# Patient Record
Sex: Male | Born: 1955 | Race: Black or African American | Hispanic: No | Marital: Married | Smoking: Former smoker
Health system: Southern US, Community
[De-identification: ages and names within clinical notes are randomized; demographics above are authoritative.]

## PROBLEM LIST (undated history)

## (undated) DIAGNOSIS — G8929 Other chronic pain: Secondary | ICD-10-CM

## (undated) DIAGNOSIS — M549 Dorsalgia, unspecified: Secondary | ICD-10-CM

## (undated) DIAGNOSIS — C3490 Malignant neoplasm of unspecified part of unspecified bronchus or lung: Secondary | ICD-10-CM

## (undated) HISTORY — PX: BACK SURGERY: SHX140

---

## 2005-05-26 ENCOUNTER — Ambulatory Visit (HOSPITAL_COMMUNITY): Admission: RE | Admit: 2005-05-26 | Discharge: 2005-05-27 | Payer: Self-pay | Admitting: Neurosurgery

## 2005-12-22 ENCOUNTER — Encounter: Admission: RE | Admit: 2005-12-22 | Discharge: 2005-12-22 | Payer: Self-pay | Admitting: Neurosurgery

## 2006-01-11 ENCOUNTER — Encounter: Admission: RE | Admit: 2006-01-11 | Discharge: 2006-01-11 | Payer: Self-pay | Admitting: Neurosurgery

## 2006-01-25 ENCOUNTER — Encounter: Admission: RE | Admit: 2006-01-25 | Discharge: 2006-01-25 | Payer: Self-pay | Admitting: Neurosurgery

## 2007-03-08 ENCOUNTER — Inpatient Hospital Stay (HOSPITAL_COMMUNITY): Admission: RE | Admit: 2007-03-08 | Discharge: 2007-03-12 | Payer: Self-pay | Admitting: Neurosurgery

## 2010-08-04 ENCOUNTER — Encounter
Admission: RE | Admit: 2010-08-04 | Discharge: 2010-08-10 | Payer: Self-pay | Source: Home / Self Care | Attending: Physical Medicine & Rehabilitation | Admitting: Physical Medicine & Rehabilitation

## 2010-08-10 ENCOUNTER — Ambulatory Visit: Payer: Self-pay | Admitting: Physical Medicine & Rehabilitation

## 2010-10-13 ENCOUNTER — Encounter
Admission: RE | Admit: 2010-10-13 | Discharge: 2010-12-08 | Payer: Self-pay | Source: Home / Self Care | Attending: Physical Medicine & Rehabilitation | Admitting: Physical Medicine & Rehabilitation

## 2010-10-13 ENCOUNTER — Ambulatory Visit: Payer: Self-pay | Admitting: Physical Medicine & Rehabilitation

## 2011-01-12 ENCOUNTER — Encounter: Payer: Medicare Other | Attending: Physical Medicine & Rehabilitation

## 2011-01-12 ENCOUNTER — Ambulatory Visit: Payer: Medicare Other | Admitting: Physical Medicine & Rehabilitation

## 2011-01-12 DIAGNOSIS — M961 Postlaminectomy syndrome, not elsewhere classified: Secondary | ICD-10-CM

## 2011-01-12 DIAGNOSIS — IMO0002 Reserved for concepts with insufficient information to code with codable children: Secondary | ICD-10-CM | POA: Insufficient documentation

## 2011-01-12 DIAGNOSIS — M545 Low back pain, unspecified: Secondary | ICD-10-CM | POA: Insufficient documentation

## 2011-03-26 NOTE — Op Note (Signed)
NAME:  Logan Briggs, Logan Briggs              ACCOUNT NO.:  [id number]   MEDICAL RECORD NO.:  [id number]          PATIENT TYPE:  INP   LOCATION:  NA                           FACILITY:  [facility]   PHYSICIAN:  Coletta Memos, M.D.     DATE OF BIRTH:  [birth year]   DATE OF PROCEDURE:  03/08/2007  DATE OF DISCHARGE:                               OPERATIVE REPORT   PREOPERATIVE DIAGNOSES:  1. Recurrent disk, left L4-5.  2. Left L4 and left L5 radiculopathies.   POSTOPERATIVE DIAGNOSES:  1. Foraminal narrowing secondary to scar formation at L4-5.  2. Left L4 and left L5 radiculopathies.   PROCEDURES:  1. Left L4-5 transverse lumbar interbody arthrodesis with 11 x 32 mm      Opal cage filled with morcellized autograft, arthrodesis with      morcellized allograft, Vitoss.  2. Posterolateral arthrodesis, L4-5, with morcellized autograft and      allograft graft.  3. Spire plate placement.   COMPLICATIONS:  None.   SURGEON:  Coletta Memos, M.D.   ASSISTANT:  Danae Orleans. Venetia Maxon, M.D.   ANESTHESIA:  General endotracheal.   INDICATIONS:  Logan Briggs is a 55 year old gentleman whom I took to  the operating room in June 2006.  At that time he underwent a left L4-5  laminectomy and diskectomy.  Postoperatively he was slightly better but  always had persistent pain in his left lower extremity and back pain.  After a significant amount of time Logan Briggs has opted for a redo  diskectomy.  It was never clear to me that he had actual recurrence  because on MRI all of the material would essentially enhance with  contrast.  Nevertheless, there was a significant amount of mass effect  on the L5 and L4 nerve roots and be it scar or recurrent disk, he was  still being affected.   OPERATIVE NOTE:  Logan Briggs was taken to the operating room, intubated  and placed under a general anesthetic without difficulty.  A Foley  catheter was placed under sterile conditions.  He was rolled prone onto  a Jackson table  and all pressure points were properly padded.  His back  was prepped and he was draped in a sterile fashion.  I opened the old  incision after infiltrating 10 mL of 0.5% lidocaine with 1:200,000  epinephrine.  I took the incision down and exposed the lamina of that I  believed to be L4 and L5 bilaterally.  a double-ended ganglion knife was  placed inferior to the superior lamina in the exposure at that point,  and that was between the L4 and L5 lamina.  I then just on the left side  used the high-speed drill and Kerrison punch to perform a facetectomy  and laminectomy on the left side.  I did this until I was able to easily  palpate the left L4 pedicle.  There was a great deal of scar tissue.  There was no point that I encountered what I thought was recurrent disk  material.  With that amount of scar tissue, I was able to pass fully  decompress the disk space by using curettes and pituitary rongeurs.  I  also took some down along the midline, where again it was very thickened  material.  When I had done that, I then prepared for the arthrodesis by  scraping the endplates until I had nice, fresh bony beds available.  I  then placed 5 mL of the Vitoss allograft into the disk space.  I packed  the Opal cage with morcellized autograft and then placed that into the  disk space.   I then prepared for the posterolateral arthrodesis.  I decorticated the  lamina of L4 and L5 on the right side.  I then placed another 5 mL of  Vitoss along that area.   We then placed a Spire plate at L4 and L5 with Dr. Fredrich Birks assistance.   There was a great deal of scar tissue surrounding the left side of the  thecal sac at the L4-5 space along L4 and L5 roots, but I was able to  fully decompress the left L4 nerve root and L5.  I took down essentially  the entire facette and that most of the superior facette of L5 overlying  the disk space.   I closed the wound in layered fashion using Vicryl sutures to   reapproximate the thoracolumbar fascia, subcutaneous and subcuticular  layers.  I used 3-0 nylon to reapproximate the skin edges.  A sterile  dressing was applied.  X-ray showed the plate and the interbody in good  position.           ______________________________  Coletta Memos, M.D.     KC/MEDQ  D:  03/08/2007  T:  03/08/2007  Job:  [id number]

## 2011-03-26 NOTE — Discharge Summary (Signed)
NAME:  Logan Briggs, Logan Briggs NO.:  [id number]   MEDICAL RECORD NO.:  [id number]          PATIENT TYPE:  INP   LOCATION:  3004                         FACILITY:  [facility]   PHYSICIAN:  Payton Doughty, M.D.      DATE OF BIRTH:  June 27, 1956   DATE OF ADMISSION:  03/08/2007  DATE OF DISCHARGE:  03/12/2007                               DISCHARGE SUMMARY   ADMISSION DIAGNOSIS:  Lumbar spondylosis L4-5.   DISCHARGE DIAGNOSIS:  Lumbar spondylosis L4-5.   PROCEDURE:  L4-5 laminectomy, diskectomy, interbody fusion and  __________ placement.   COMPLICATIONS:  None.   ATTENDING PHYSICIAN:  Coletta Memos, M.D.   HISTORY:  A 55 year old gentleman whose history and physical and  accounted in the chart.  Had a diskectomy at 4-5 in June 2006 and did  well and has had recurrent pain and recurrent disk and is now admitted  for fusion.  General exam is intact.  He was taken to the operating room  after ascertaining normal laboratory values and underwent fusion as  noted above.  Postoperatively he has done well.  His incision is dry and  well-healing.  His strength is full.  His Foley was removed the first  day, PCA stopped the second day.  He is up, eating, and voiding normally  now.  He is being discharged home in care of his family with Percocet  for pain.  His followup will be in the [place] office in a couple of  weeks.           ______________________________  Payton Doughty, M.D.     MWR/MEDQ  D:  03/12/2007  T:  03/12/2007  Job:  [id number]

## 2011-04-12 ENCOUNTER — Encounter: Payer: Medicare Other | Attending: Neurosurgery | Admitting: Neurosurgery

## 2011-04-12 DIAGNOSIS — M5430 Sciatica, unspecified side: Secondary | ICD-10-CM

## 2011-04-12 DIAGNOSIS — M549 Dorsalgia, unspecified: Secondary | ICD-10-CM | POA: Insufficient documentation

## 2011-04-12 DIAGNOSIS — G8929 Other chronic pain: Secondary | ICD-10-CM | POA: Insufficient documentation

## 2011-04-12 DIAGNOSIS — Z981 Arthrodesis status: Secondary | ICD-10-CM | POA: Insufficient documentation

## 2011-04-12 DIAGNOSIS — M79609 Pain in unspecified limb: Secondary | ICD-10-CM | POA: Insufficient documentation

## 2011-04-12 DIAGNOSIS — M961 Postlaminectomy syndrome, not elsewhere classified: Secondary | ICD-10-CM | POA: Insufficient documentation

## 2011-04-13 NOTE — Assessment & Plan Note (Signed)
HISTORY OF PRESENT ILLNESS:  Logan Briggs is followed here by Dr. Wynn Banker for back pain and left lower extremity pain.  He was involved in the work injury in 2005.  He had neurosurgical of evaluation with [facility] in 2008 and persistent pain.  The patient rates his pain at about 6-7, it is sharp, stabbing, unchanged from previous, some activities increase the pain, medication tends to help.  Mobility, he walks without assistance, does not live, he is on disability.  REVIEW OF SYSTEMS:  Notable for those difficulties, otherwise unremarkable.  PAST MEDICAL HISTORY:  Unchanged.  SOCIAL HISTORY:  Unchanged.  PHYSICAL EXAMINATION:  VITAL SIGNS:  Blood pressure 111/78, pulse 82, respirations 20, O2 sats 93 on room air. MUSCULOSKELETAL:  Motor strength is good in the lower extremities. Sensation is intact.  His affect is bright and alert.  Constitutionally, is within normal limits.  He does walk without a gait again, but he does have some diminished reflexes on the left at the quadriceps and gastrocs.  IMPRESSION: 1. Post laminectomy syndrome. 2. Chronic pain.  PLAN: 1. We are going his hydrocodone 7.5/325 one p.o. t.i.d. just got that     filled, no prescriptions given. 2. Continue his other meds as planned. 3. We will see him back in office in 3 months.  His questions were     encouraged and answered.     Logan Briggs Electronically Signed    RLW/MedQ D:  04/12/2011 13:28:11  T:  04/13/2011 02:11:59  Job #:  [id number]

## 2011-07-13 ENCOUNTER — Encounter: Payer: Medicare Other | Attending: Neurosurgery

## 2011-07-13 ENCOUNTER — Ambulatory Visit: Payer: Medicare Other | Admitting: Physical Medicine & Rehabilitation

## 2011-07-13 DIAGNOSIS — M79609 Pain in unspecified limb: Secondary | ICD-10-CM | POA: Insufficient documentation

## 2011-07-13 DIAGNOSIS — M549 Dorsalgia, unspecified: Secondary | ICD-10-CM | POA: Insufficient documentation

## 2011-07-13 DIAGNOSIS — M961 Postlaminectomy syndrome, not elsewhere classified: Secondary | ICD-10-CM

## 2011-07-13 DIAGNOSIS — Z981 Arthrodesis status: Secondary | ICD-10-CM | POA: Insufficient documentation

## 2011-07-13 DIAGNOSIS — G8929 Other chronic pain: Secondary | ICD-10-CM | POA: Insufficient documentation

## 2011-07-13 NOTE — Assessment & Plan Note (Signed)
REASON FOR VISIT:  Lumbar postlaminectomy syndrome, status post L4-5 fusion.  HISTORY:  Logan Briggs has last been seen by Dr. Kallie Edward on April 12, 2011, last seen by me in March.  Has had no new problems per his report. Pain is 5-6/10 range, sometimes 2/7,  this is similar to prior.  Sleep is good.  He can walk 15 minutes at a time.  He can drive.  He climbs steps.  He is single, lives alone.  Smokes half pack per day.  PHYSICAL EXAMINATION:  VITAL SIGNS: Blood pressure 126/70, pulse 72, respirations 18 and O2 sat 93% on room air. MUSCULOSKELETAL:  Straight leg raising test is negative.  Gait is normal.  Deep tendon reflexes reduced at the left knee compared to right knee, intact bilateral ankles.  Lumbar spine range of motion 25% forward flexion, extension, lateral rotation, and bending.  IMPRESSION:  Lumbar postlaminectomy syndrome, chronic left lumbar radiculopathy.  PLAN: 1. Continue hydrocodone 7.5/325 t.i.d. 2. Continue nortriptyline. 3. I will see him back to 9 months and nurse practitioner visit in 3     months.  We discussed importance of activity, keeping up with     walking.  He does some of his yard work as well.     Erick Colace, M.D. Electronically Signed    AEK/MedQ D:  07/13/2011 15:59:08  T:  07/13/2011 21:51:43  Job #:  [id number]

## 2011-10-05 ENCOUNTER — Encounter: Payer: Medicare Other | Attending: Neurosurgery | Admitting: Neurosurgery

## 2011-10-05 DIAGNOSIS — M5430 Sciatica, unspecified side: Secondary | ICD-10-CM

## 2011-10-05 DIAGNOSIS — M961 Postlaminectomy syndrome, not elsewhere classified: Secondary | ICD-10-CM

## 2011-10-05 DIAGNOSIS — M545 Low back pain, unspecified: Secondary | ICD-10-CM | POA: Insufficient documentation

## 2011-10-05 DIAGNOSIS — IMO0002 Reserved for concepts with insufficient information to code with codable children: Secondary | ICD-10-CM | POA: Insufficient documentation

## 2011-10-05 NOTE — Assessment & Plan Note (Signed)
This is a patient of Dr. Wynn Banker', seen for left-sided low back pain and left radiculopathy.  He reports no change in his pain.  He is doing well with his medicines.  Last UDS, pill count is consistent.  Rates his average pain unchanged at a 7.  It is a dull, aching-type pain.  General activity level is a 5 to a 7.  Pain is worse in the evening and at night.  Pain is worse with walking, standing, and activity.  Rest and medication help.  He walks without assistance.  He does drive. Functionally, he is independent.  REVIEW OF SYSTEMS:  Notable for difficulties described above, otherwise within normal limits.  PAST MEDICAL HISTORY:  Unchanged.  SOCIAL HISTORY:  Unchanged.  FAMILY HISTORY:  Unchanged.  PHYSICAL EXAMINATION:  VITAL SIGNS:  Blood pressure 122/64, pulse 79, O2 sats 96 on room air. NEUROLOGIC:  His motor strength and sensation are intact. CONSTITUTIONAL:  Within normal limits.  He is alert and oriented x3. Has normal gait.  IMPRESSION:  Postlaminectomy syndrome with chronic left lumbar radiculopathy.  PLAN:  We will keep his hydrocodone refilled, he just got that done. Continue nortriptyline.  We will see him back in 3 months.  His questions were encouraged and answered.     Logan Briggs L. Blima Dessert Electronically Signed    RLW/MedQ D:  10/05/2011 13:12:54  T:  10/05/2011 21:24:26  Job #:  [id number]

## 2011-12-28 ENCOUNTER — Encounter: Payer: Medicare Other | Attending: Neurosurgery | Admitting: *Deleted

## 2011-12-28 DIAGNOSIS — M545 Low back pain, unspecified: Secondary | ICD-10-CM | POA: Insufficient documentation

## 2011-12-28 DIAGNOSIS — IMO0002 Reserved for concepts with insufficient information to code with codable children: Secondary | ICD-10-CM | POA: Insufficient documentation

## 2011-12-28 DIAGNOSIS — M961 Postlaminectomy syndrome, not elsewhere classified: Secondary | ICD-10-CM | POA: Insufficient documentation

## 2011-12-28 NOTE — Progress Notes (Signed)
  Subjective:    Patient ID: Logan Briggs, male    DOB: May 09, 1956, 56 y.o.   MRN: [id number]  Back Pain This is a chronic problem. The current episode started more than 1 year ago. The problem occurs constantly. The problem is unchanged. The pain is present in the lumbar spine. The quality of the pain is described as stabbing (sharp). The pain radiates to the left foot. The pain is at a severity of 7/10. The pain is moderate. The pain is the same all the time. The symptoms are aggravated by bending, standing and position. Associated symptoms include leg pain, numbness, tingling and weakness. He has tried home exercises, ice and walking for the symptoms. The treatment provided mild relief.  Leg Pain  Associated symptoms include numbness and tingling.      Review of Systems  Gastrointestinal: Positive for constipation.  Musculoskeletal: Positive for back pain.  Neurological: Positive for tingling, weakness and numbness.       Objective:   Physical Exam        Assessment & Plan:

## 2011-12-29 MED ORDER — HYDROCODONE-ACETAMINOPHEN 7.5-325 MG PO TABS: 1.0000 | ORAL_TABLET | Freq: Four times a day (QID) | ORAL | Status: DC

## 2011-12-29 NOTE — Progress Notes (Signed)
Addended by: Leonie Douglas A on: 12/29/2011 11:24 AM   Modules accepted: Orders

## 2012-02-07 ENCOUNTER — Encounter: Payer: Self-pay | Admitting: Physical Medicine & Rehabilitation

## 2012-02-07 ENCOUNTER — Ambulatory Visit (HOSPITAL_BASED_OUTPATIENT_CLINIC_OR_DEPARTMENT_OTHER): Payer: Medicare Other | Admitting: Physical Medicine & Rehabilitation

## 2012-02-07 ENCOUNTER — Encounter: Payer: Medicare Other | Attending: Neurosurgery

## 2012-02-07 VITALS — BP 125/64 | HR 71 | Resp 16 | Ht 76.0 in | Wt 235.0 lb

## 2012-02-07 DIAGNOSIS — M961 Postlaminectomy syndrome, not elsewhere classified: Secondary | ICD-10-CM | POA: Insufficient documentation

## 2012-02-07 DIAGNOSIS — M5430 Sciatica, unspecified side: Secondary | ICD-10-CM

## 2012-02-07 DIAGNOSIS — IMO0002 Reserved for concepts with insufficient information to code with codable children: Secondary | ICD-10-CM | POA: Insufficient documentation

## 2012-02-07 DIAGNOSIS — M545 Low back pain, unspecified: Secondary | ICD-10-CM | POA: Insufficient documentation

## 2012-02-07 MED ORDER — HYDROCODONE-ACETAMINOPHEN 7.5-325 MG PO TABS: 1.0000 | ORAL_TABLET | Freq: Four times a day (QID) | ORAL | Status: DC

## 2012-02-07 NOTE — Patient Instructions (Signed)
Chronic Back Pain When back pain lasts longer than 3 months, it is called chronic back pain.This pain can be frustrating, but the cause of the pain is rarely dangerous.People with chronic back pain often go through certain periods that are more intense (flare-ups). CAUSES Chronic back pain can be caused by wear and tear (degeneration) on different structures in your back. These structures may include bones, ligaments, or discs. This degeneration may result in more pressure being placed on the nerves that travel to your legs and feet. This can lead to pain traveling from the low back down the back of the legs. When pain lasts longer than 3 months, it is not unusual for people to experience anxiety or depression. Anxiety and depression can also contribute to low back pain. TREATMENT  Establish a regular exercise plan. This is critical to improving your functional level.   Have a self-management plan for when you flare-up. Flare-ups rarely require a medical visit. Regular exercise will help reduce the intensity and frequency of your flare-ups.   Manage how you feel about your back pain and the rest of your life. Anxiety, depression, and feeling that you cannot alter your back pain have been shown to make back pain more intense and debilitating.   Medicines should never be your only treatment. They should be used along with other treatments to help you return to a more active lifestyle.   Procedures such as injections or surgery may be helpful but are rarely necessary. You may be able to get the same results with physical therapy or chiropractic care.  HOME CARE INSTRUCTIONS  Avoid bending, heavy lifting, prolonged sitting, and activities which make the problem worse.   Continue normal activity as much as possible.   Take brief periods of rest throughout the day to reduce your pain during flare-ups.   Follow your back exercise rehabilitation program. This can help reduce symptoms and prevent  more pain.   Only take over-the-counter or prescription medicines as directed by your caregiver. Muscle relaxants are sometimes prescribed. Narcotic pain medicine is discouraged for long-term pain, since addiction is a possible outcome.   If you smoke, quit.   Eat healthy foods and maintain a recommended body weight.  SEEK IMMEDIATE MEDICAL CARE IF:   You have weakness or numbness in one of your legs or feet.   You have trouble controlling your bladder or bowels.   You develop nausea, vomiting, abdominal pain, shortness of breath, or fainting.  Document Released: 12/02/2004 Document Revised: 10/14/2011 Document Reviewed: 10/09/2011 [employer] Patient Information 2012 [employer]. 

## 2012-02-07 NOTE — Progress Notes (Signed)
  Subjective:    Patient ID: Logan Briggs, male    DOB: 31-Jul-1956, 56 y.o.   MRN: [id number]  HPI REASON FOR VISIT: Lumbar postlaminectomy syndrome, status post L4-5  fusion.  Pain Inventory Average Pain 7 Pain Right Now 7 My pain is sharp and stabbing  In the last 24 hours, has pain interfered with the following? General activity 5 Relation with others 5 Enjoyment of life 6 What TIME of day is your pain at its worst? Evening and Night Sleep (in general) Good  Pain is worse with: some activites Pain improves with: heat/ice and medication Relief from Meds: 7  Mobility how many minutes can you walk? 1 hour ability to climb steps?  yes do you drive?  yes Do you have any goals in this area?  no  Function I need assistance with the following:  household duties  Neuro/Psych No problems in this area  Prior Studies Any changes since last visit?  no  Physicians involved in your care Any changes since last visit?  no  Review of Systems  Constitutional: Negative.   HENT: Negative.   Eyes: Negative.   Respiratory: Negative.   Cardiovascular: Negative.   Gastrointestinal: Positive for constipation.  Genitourinary: Negative.   Musculoskeletal: Negative.   Skin: Negative.   Neurological: Negative.   Hematological: Negative.   Psychiatric/Behavioral: Negative.        Objective:   Physical Exam  Constitutional: He is oriented to person, place, and time. He appears well-developed and well-nourished.  HENT:  Head: Normocephalic and atraumatic.  Musculoskeletal:       Lumbar back: He exhibits decreased range of motion.       Extension at the lumbar spine is more painful than flexion.  Neurological: He is alert and oriented to person, place, and time.  Reflex Scores:      Patellar reflexes are 1+ on the right side and 1+ on the left side.      Achilles reflexes are 1+ on the right side and 1+ on the left side. Psychiatric: He has a normal mood and affect.   straight leg raising test is negative.        Assessment & Plan:  1. Lumbar postlaminectomy syndrome with chronic postoperative pain. He does have some radicular symptoms but does not wish to try another neuropathic pain medication such as gabapentin. He has been on nortriptyline in the past.

## 2012-02-08 ENCOUNTER — Encounter: Payer: Self-pay | Admitting: Physical Medicine & Rehabilitation

## 2012-03-14 ENCOUNTER — Encounter: Payer: Medicare Other | Attending: Neurosurgery

## 2012-03-14 ENCOUNTER — Encounter: Payer: Self-pay | Admitting: Physical Medicine & Rehabilitation

## 2012-03-14 ENCOUNTER — Ambulatory Visit (HOSPITAL_BASED_OUTPATIENT_CLINIC_OR_DEPARTMENT_OTHER): Payer: Medicare Other | Admitting: Physical Medicine & Rehabilitation

## 2012-03-14 ENCOUNTER — Telehealth: Payer: Self-pay | Admitting: Physical Medicine & Rehabilitation

## 2012-03-14 ENCOUNTER — Other Ambulatory Visit: Payer: Self-pay

## 2012-03-14 VITALS — BP 139/65 | HR 89 | Resp 14 | Ht 75.0 in | Wt 242.0 lb

## 2012-03-14 DIAGNOSIS — IMO0002 Reserved for concepts with insufficient information to code with codable children: Secondary | ICD-10-CM | POA: Insufficient documentation

## 2012-03-14 DIAGNOSIS — M545 Low back pain, unspecified: Secondary | ICD-10-CM | POA: Insufficient documentation

## 2012-03-14 DIAGNOSIS — M961 Postlaminectomy syndrome, not elsewhere classified: Secondary | ICD-10-CM | POA: Insufficient documentation

## 2012-03-14 MED ORDER — HYDROCODONE-ACETAMINOPHEN 7.5-325 MG PO TABS: 1.0000 | ORAL_TABLET | Freq: Four times a day (QID) | ORAL | Status: DC

## 2012-03-14 NOTE — Telephone Encounter (Signed)
What about leg pain medicine that was to be faxed to [facility]?  He was here this morning.

## 2012-03-14 NOTE — Patient Instructions (Signed)
May alternate heat and ice to L buttocks for the next 1-2 weeks.

## 2012-03-14 NOTE — Telephone Encounter (Signed)
Hydrocodone was to help with leg pain. Pt aware.

## 2012-03-14 NOTE — Progress Notes (Signed)
  Subjective:    Patient ID: Logan Briggs, male    DOB: 04/16/1956, 56 y.o.   MRN: [id number]  HPI Increase L buttock and leg pain after sitting on wallet.  No trauma.  No other issues.  States friend smoke THC "would that show up"? Pain Inventory Average Pain 7 Pain Right Now 7 My pain is sharp and stabbing  In the last 24 hours, has pain interfered with the following? General activity 5 Relation with others 5 Enjoyment of life 6 What TIME of day is your pain at its worst? evening and night Sleep (in general) Good  Pain is worse with: some activites Pain improves with: heat/ice Relief from Meds: 7  Mobility walk without assistance how many minutes can you walk? 1 hr ability to climb steps?  yes do you drive?  yes Do you have any goals in this area?  no  Function not employed: date last employed   Neuro/Psych No problems in this area  Prior Studies Any changes since last visit?  no  Physicians involved in your care Any changes since last visit?  no        Review of Systems  Constitutional: Negative.   HENT: Negative.   Eyes: Negative.   Respiratory: Negative.   Cardiovascular: Negative.   Gastrointestinal: Negative.   Genitourinary: Negative.   Musculoskeletal: Negative.   Skin: Negative.   Neurological: Negative.   Hematological: Negative.   Psychiatric/Behavioral: Negative.        Objective:   Physical Exam  Constitutional: He is oriented to person, place, and time. He appears well-developed and well-nourished.  Musculoskeletal:       Lumbar back: Normal.  Neurological: He is alert and oriented to person, place, and time. He has normal strength and normal reflexes. No sensory deficit. Gait normal.          Assessment & Plan:  1.Lumbar post lami syndrome s/p L4-5 lami.  Functioning Indep, no need forhandicap tag.  ? Marijuana use , check UDS.  RTC 3 mo PA clinic 2.  L leg buttocks pain after sitting on wallet, likely myofascial, no sign  of sciatic neuropathy, should improve over the next couple weeks

## 2012-03-15 ENCOUNTER — Telehealth: Payer: Self-pay | Admitting: *Deleted

## 2012-03-15 ENCOUNTER — Other Ambulatory Visit: Payer: Self-pay | Admitting: Physical Medicine & Rehabilitation

## 2012-03-15 NOTE — Telephone Encounter (Signed)
Received a fax from pt pharmacy asking for a refill on Pamelor 10mg  1 po QHS #30. Is it okay to fill this?

## 2012-03-16 MED ORDER — NORTRIPTYLINE HCL 10 MG PO CAPS: 10.0000 mg | ORAL_CAPSULE | Freq: Every day | ORAL | Status: DC

## 2012-03-16 NOTE — Telephone Encounter (Signed)
OK to RF

## 2012-03-16 NOTE — Telephone Encounter (Signed)
Rx refilled.

## 2012-04-20 ENCOUNTER — Other Ambulatory Visit: Payer: Self-pay | Admitting: *Deleted

## 2012-04-20 MED ORDER — HYDROCODONE-ACETAMINOPHEN 7.5-325 MG PO TABS: 1.0000 | ORAL_TABLET | Freq: Four times a day (QID) | ORAL | Status: DC

## 2012-05-22 ENCOUNTER — Other Ambulatory Visit: Payer: Self-pay | Admitting: *Deleted

## 2012-05-22 MED ORDER — HYDROCODONE-ACETAMINOPHEN 7.5-325 MG PO TABS: 1.0000 | ORAL_TABLET | Freq: Four times a day (QID) | ORAL | Status: DC

## 2012-06-14 ENCOUNTER — Encounter: Payer: Self-pay | Admitting: Physical Medicine and Rehabilitation

## 2012-06-14 ENCOUNTER — Encounter
Payer: Medicare Other | Attending: Physical Medicine and Rehabilitation | Admitting: Physical Medicine and Rehabilitation

## 2012-06-14 VITALS — BP 145/64 | HR 69 | Resp 14 | Ht 75.0 in | Wt 240.6 lb

## 2012-06-14 DIAGNOSIS — Z79899 Other long term (current) drug therapy: Secondary | ICD-10-CM | POA: Insufficient documentation

## 2012-06-14 DIAGNOSIS — M961 Postlaminectomy syndrome, not elsewhere classified: Secondary | ICD-10-CM | POA: Insufficient documentation

## 2012-06-14 DIAGNOSIS — R209 Unspecified disturbances of skin sensation: Secondary | ICD-10-CM | POA: Insufficient documentation

## 2012-06-14 DIAGNOSIS — M79609 Pain in unspecified limb: Secondary | ICD-10-CM | POA: Insufficient documentation

## 2012-06-14 DIAGNOSIS — M545 Low back pain, unspecified: Secondary | ICD-10-CM | POA: Insufficient documentation

## 2012-06-14 DIAGNOSIS — G8929 Other chronic pain: Secondary | ICD-10-CM | POA: Insufficient documentation

## 2012-06-14 DIAGNOSIS — Q7649 Other congenital malformations of spine, not associated with scoliosis: Secondary | ICD-10-CM

## 2012-06-14 DIAGNOSIS — M7989 Other specified soft tissue disorders: Secondary | ICD-10-CM | POA: Insufficient documentation

## 2012-06-14 DIAGNOSIS — M4326 Fusion of spine, lumbar region: Secondary | ICD-10-CM

## 2012-06-14 DIAGNOSIS — R252 Cramp and spasm: Secondary | ICD-10-CM | POA: Insufficient documentation

## 2012-06-14 MED ORDER — METHOCARBAMOL 500 MG PO TABS: 500.0000 mg | ORAL_TABLET | Freq: Three times a day (TID) | ORAL | Status: AC

## 2012-06-14 MED ORDER — HYDROCODONE-ACETAMINOPHEN 7.5-325 MG PO TABS: 1.0000 | ORAL_TABLET | Freq: Four times a day (QID) | ORAL | Status: DC

## 2012-06-14 NOTE — Patient Instructions (Signed)
Continue with exercising and stretching, continue with walking program.

## 2012-06-14 NOTE — Progress Notes (Signed)
Subjective:    Patient ID: Logan Briggs, male    DOB: 09-14-1956, 56 y.o.   MRN: [id number]  HPI The patient complains about chronic low back pain which radiates into left LE, down to the lateral lower leg. The patient also complains about numbness and tingling in the same distribution. The problem has been stable . Hx of L4-5 fusion,2010. Patient states, that his left lower leg is a little swollen. Pain Inventory Average Pain 7 Pain Right Now 7 My pain is sharp and stabbing  In the last 24 hours, has pain interfered with the following? General activity 5 Relation with others 5 Enjoyment of life 6 What TIME of day is your pain at its worst? evening and night Sleep (in general) Good  Pain is worse with: some activites Pain improves with: heat/ice and medication Relief from Meds: 7  Mobility walk without assistance how many minutes can you walk? 60 ability to climb steps?  yes do you drive?  yes  Function disabled: date disabled  I need assistance with the following:  household duties  Neuro/Psych weakness numbness tingling  Prior Studies Any changes since last visit?  no  Physicians involved in your care Any changes since last visit?  no   History reviewed. No pertinent family history. History   Social History  . Marital Status: Married    Spouse Name: N/A    Number of Children: N/A  . Years of Education: N/A   Social History Main Topics  . Smoking status: Current Everyday Smoker -- 1.5 packs/day for 20 years    Types: Cigarettes  . Smokeless tobacco: Never Used   Comment: desires to quit but starts again when upset or stressed  . Alcohol Use: Yes     only on holidays, beer  . Drug Use: No  . Sexually Active: None   Other Topics Concern  . None   Social History Narrative  . None   History reviewed. No pertinent past surgical history. Past Medical History  Diagnosis Date  . Glaucoma    BP 145/64  Pulse 69  Resp 14  Ht 6\' 3"  (1.905 m)   Wt 240 lb 9.6 oz (109.135 kg)  BMI 30.07 kg/m2  SpO2 95%    Review of Systems  Musculoskeletal: Positive for back pain.  Neurological: Positive for weakness and numbness.       Tingling  All other systems reviewed and are negative.       Objective:   Physical Exam  Constitutional: He is oriented to person, place, and time. He appears well-developed and well-nourished.  HENT:  Head: Normocephalic.  Neck: Neck supple.  Musculoskeletal: He exhibits tenderness.  Neurological: He is alert and oriented to person, place, and time.  Skin: Skin is warm and dry.  Psychiatric: He has a normal mood and affect.    Symmetric normal motor tone is noted throughout. Normal muscle bulk. Muscle testing reveals 5/5 muscle strength of the upper extremity, and 5/5 of the lower extremity. Full range of motion in upper and lower extremities. ROM of spine is restricted. Fine motor movements are normal in both hands.  DTR in the upper and lower extremity are present and symmetric 1+. No clonus is noted.  Patient arises from chair without difficulty. Narrow based gait with normal arm swing bilateral , able to walk on heels and toes . Tandem walk is stable. No pronator drift. Rhomberg negative. Not really noticing swelling of left lower leg, which patient is complaining about, no  erythema, no increase in temp.       Assessment & Plan:  1. Lumbar postlaminectomy syndrome with chronic postoperative pain. He does have some radicular symptoms but does not wish to try another neuropathic pain medication such as gabapentin. He has been on nortriptyline in the past. 2. Prescribed Robaxin for muscle cramps prn Continue with exercising and walking program. Follow up with PCP for very mild subjective swollen left lower leg. Follow up in 3 month

## 2012-07-25 ENCOUNTER — Other Ambulatory Visit: Payer: Self-pay | Admitting: *Deleted

## 2012-07-25 MED ORDER — HYDROCODONE-ACETAMINOPHEN 7.5-325 MG PO TABS: 1.0000 | ORAL_TABLET | Freq: Four times a day (QID) | ORAL | Status: DC

## 2012-08-24 ENCOUNTER — Other Ambulatory Visit: Payer: Self-pay | Admitting: *Deleted

## 2012-08-24 MED ORDER — HYDROCODONE-ACETAMINOPHEN 7.5-325 MG PO TABS: 1.0000 | ORAL_TABLET | Freq: Four times a day (QID) | ORAL | Status: DC

## 2012-09-13 ENCOUNTER — Encounter
Payer: Medicare Other | Attending: Physical Medicine and Rehabilitation | Admitting: Physical Medicine and Rehabilitation

## 2012-09-18 ENCOUNTER — Encounter: Payer: Self-pay | Admitting: Physical Medicine and Rehabilitation

## 2012-09-18 ENCOUNTER — Encounter
Payer: Medicare Other | Attending: Physical Medicine and Rehabilitation | Admitting: Physical Medicine and Rehabilitation

## 2012-09-18 VITALS — BP 133/68 | HR 82 | Resp 14 | Ht 74.0 in | Wt 246.0 lb

## 2012-09-18 DIAGNOSIS — F17200 Nicotine dependence, unspecified, uncomplicated: Secondary | ICD-10-CM | POA: Insufficient documentation

## 2012-09-18 DIAGNOSIS — Z981 Arthrodesis status: Secondary | ICD-10-CM | POA: Insufficient documentation

## 2012-09-18 DIAGNOSIS — G8928 Other chronic postprocedural pain: Secondary | ICD-10-CM | POA: Insufficient documentation

## 2012-09-18 DIAGNOSIS — M549 Dorsalgia, unspecified: Secondary | ICD-10-CM

## 2012-09-18 DIAGNOSIS — Z5181 Encounter for therapeutic drug level monitoring: Secondary | ICD-10-CM

## 2012-09-18 DIAGNOSIS — M545 Low back pain, unspecified: Secondary | ICD-10-CM | POA: Insufficient documentation

## 2012-09-18 DIAGNOSIS — R209 Unspecified disturbances of skin sensation: Secondary | ICD-10-CM | POA: Insufficient documentation

## 2012-09-18 DIAGNOSIS — M961 Postlaminectomy syndrome, not elsewhere classified: Secondary | ICD-10-CM

## 2012-09-18 MED ORDER — HYDROCODONE-ACETAMINOPHEN 7.5-325 MG PO TABS: 1.0000 | ORAL_TABLET | Freq: Four times a day (QID) | ORAL | Status: DC

## 2012-09-18 NOTE — Progress Notes (Signed)
Subjective:    Patient ID: Logan Briggs, male    DOB: 25-Sep-1956, 56 y.o.   MRN: [id number]  HPI The patient complains about chronic low back pain which radiates into left LE, down to the lateral lower leg. The patient also complains about numbness and tingling in the same distribution.  The problem has been stable . Hx of L4-5 fusion,2010.  Pain Inventory Average Pain 6 Pain Right Now 6 My pain is constant, dull and tingling  In the last 24 hours, has pain interfered with the following? General activity 8 Relation with others 7 Enjoyment of life 7 What TIME of day is your pain at its worst? all the time Sleep (in general) Fair  Pain is worse with: walking, bending, sitting and standing Pain improves with: rest and medication Relief from Meds: 8  Mobility walk without assistance how many minutes can you walk? 20-30 ability to climb steps?  yes do you drive?  yes  Function disabled: date disabled   Neuro/Psych weakness tremor  Prior Studies Any changes since last visit?  no  Physicians involved in your care Any changes since last visit?  no   History reviewed. No pertinent family history. History   Social History  . Marital Status: Married    Spouse Name: N/A    Number of Children: N/A  . Years of Education: N/A   Social History Main Topics  . Smoking status: Current Every Day Smoker -- 1.5 packs/day for 20 years    Types: Cigarettes  . Smokeless tobacco: Never Used     Comment: desires to quit but starts again when upset or stressed  . Alcohol Use: Yes     Comment: only on holidays, beer  . Drug Use: No  . Sexually Active: None   Other Topics Concern  . None   Social History Narrative  . None   History reviewed. No pertinent past surgical history. Past Medical History  Diagnosis Date  . Glaucoma    BP 133/68  Pulse 82  Resp 14  Ht 6\' 2"  (1.88 m)  Wt 246 lb (111.585 kg)  BMI 31.58 kg/m2  SpO2 97%    Review of Systems    Musculoskeletal: Positive for back pain.  Neurological: Positive for tremors and weakness.  All other systems reviewed and are negative.       Objective:   Physical Exam Constitutional: He is oriented to person, place, and time. He appears well-developed and well-nourished.  HENT:  Head: Normocephalic.  Neck: Neck supple.  Musculoskeletal: He exhibits tenderness.  Neurological: He is alert and oriented to person, place, and time.  Skin: Skin is warm and dry.  Psychiatric: He has a normal mood and affect.   Symmetric normal motor tone is noted throughout. Normal muscle bulk. Muscle testing reveals 5/5 muscle strength of the upper extremity, and 5/5 of the lower extremity. Full range of motion in upper and lower extremities. ROM of spine is restricted. Fine motor movements are normal in both hands.  DTR in the upper and lower extremity are present and symmetric 1+. No clonus is noted.  Patient arises from chair without difficulty. Narrow based gait with normal arm swing bilateral , able to walk on heels and toes . Tandem walk is stable. No pronator drift. Rhomberg negative.        Assessment & Plan:  1. Lumbar postlaminectomy syndrome with chronic postoperative pain. He does have some radicular symptoms but does not wish to try another neuropathic pain medication  such as gabapentin. He has been on nortriptyline in the past.  2. Prescribed Robaxin for muscle cramps prn , patient could not tolerate the medication, he could not swallow it, and did not make a big difference anyhow. Advised patient to massage his left hip with a tennis ball, when he has cramps in this area. Continue with exercising and walking program.   Follow up in 3 month

## 2012-09-18 NOTE — Patient Instructions (Signed)
Continue with your exercise and walking program 

## 2012-10-24 ENCOUNTER — Telehealth: Payer: Self-pay | Admitting: *Deleted

## 2012-10-24 MED ORDER — HYDROCODONE-ACETAMINOPHEN 7.5-325 MG PO TABS: 1.0000 | ORAL_TABLET | Freq: Four times a day (QID) | ORAL | Status: DC

## 2012-10-24 NOTE — Telephone Encounter (Signed)
Patient needs refill on Hydrocodone °

## 2012-11-28 ENCOUNTER — Telehealth: Payer: Self-pay

## 2012-11-28 MED ORDER — HYDROCODONE-ACETAMINOPHEN 7.5-325 MG PO TABS: 1.0000 | ORAL_TABLET | Freq: Four times a day (QID) | ORAL | Status: DC

## 2012-11-28 NOTE — Telephone Encounter (Signed)
Patient called for refill on hydrocodone.  [facility] on [address].

## 2012-11-28 NOTE — Telephone Encounter (Signed)
Left message letting patient know that his prescription was refilled.

## 2012-12-18 ENCOUNTER — Encounter
Payer: Medicare Other | Attending: Physical Medicine and Rehabilitation | Admitting: Physical Medicine and Rehabilitation

## 2012-12-18 ENCOUNTER — Encounter: Payer: Self-pay | Admitting: Physical Medicine and Rehabilitation

## 2012-12-18 VITALS — BP 132/74 | HR 72 | Resp 14 | Ht 76.0 in | Wt 248.0 lb

## 2012-12-18 DIAGNOSIS — G8928 Other chronic postprocedural pain: Secondary | ICD-10-CM | POA: Insufficient documentation

## 2012-12-18 DIAGNOSIS — M961 Postlaminectomy syndrome, not elsewhere classified: Secondary | ICD-10-CM | POA: Insufficient documentation

## 2012-12-18 DIAGNOSIS — M79609 Pain in unspecified limb: Secondary | ICD-10-CM | POA: Insufficient documentation

## 2012-12-18 DIAGNOSIS — R209 Unspecified disturbances of skin sensation: Secondary | ICD-10-CM | POA: Insufficient documentation

## 2012-12-18 DIAGNOSIS — R252 Cramp and spasm: Secondary | ICD-10-CM | POA: Insufficient documentation

## 2012-12-18 MED ORDER — HYDROCODONE-ACETAMINOPHEN 7.5-325 MG PO TABS: 1.0000 | ORAL_TABLET | Freq: Four times a day (QID) | ORAL | Status: DC

## 2012-12-18 NOTE — Progress Notes (Signed)
Subjective:    Patient ID: Logan Briggs, male    DOB: November 25, 1955, 57 y.o.   MRN: [id number]  HPI The patient complains about chronic low back pain which radiates into left LE, down to the lateral lower leg. The patient also complains about numbness and tingling in the same distribution.  The problem has been stable . Hx of L4-5 fusion,2010.  Pain Inventory Average Pain 7 Pain Right Now 7 My pain is sharp and stabbing  In the last 24 hours, has pain interfered with the following? General activity 5 Relation with others 5 Enjoyment of life 6 What TIME of day is your pain at its worst? evening Sleep (in general) Good  Pain is worse with: some activites Pain improves with: heat/ice and medication Relief from Meds: 7  Mobility how many minutes can you walk? 60 ability to climb steps?  yes do you drive?  yes Do you have any goals in this area?  no  Function I need assistance with the following:  household duties  Neuro/Psych No problems in this area  Prior Studies Any changes since last visit?  no  Physicians involved in your care Any changes since last visit?  no   History reviewed. No pertinent family history. History   Social History  . Marital Status: Married    Spouse Name: N/A    Number of Children: N/A  . Years of Education: N/A   Social History Main Topics  . Smoking status: Current Every Day Smoker -- 1.50 packs/day for 20 years    Types: Cigarettes  . Smokeless tobacco: Never Used     Comment: desires to quit but starts again when upset or stressed  . Alcohol Use: Yes     Comment: only on holidays, beer  . Drug Use: No  . Sexually Active: None   Other Topics Concern  . None   Social History Narrative  . None   History reviewed. No pertinent past surgical history. Past Medical History  Diagnosis Date  . Glaucoma    BP 132/74  Pulse 72  Resp 14  Ht 6\' 4"  (1.93 m)  Wt 248 lb (112.492 kg)  BMI 30.2 kg/m2  SpO2 97%     Review of  Systems  Musculoskeletal: Positive for back pain.  All other systems reviewed and are negative.       Objective:   Physical Exam Constitutional: He is oriented to person, place, and time. He appears well-developed and well-nourished.  HENT:  Head: Normocephalic.  Neck: Neck supple.  Musculoskeletal: He exhibits tenderness.  Neurological: He is alert and oriented to person, place, and time.  Skin: Skin is warm and dry.  Psychiatric: He has a normal mood and affect.  Symmetric normal motor tone is noted throughout. Normal muscle bulk. Muscle testing reveals 5/5 muscle strength of the upper extremity, and 5/5 of the lower extremity. Full range of motion in upper and lower extremities. ROM of spine is restricted. Fine motor movements are normal in both hands.  DTR in the upper and lower extremity are present and symmetric 1+. No clonus is noted.  Patient arises from chair without difficulty. Narrow based gait with normal arm swing bilateral , able to walk on heels and toes . Tandem walk is stable. No pronator drift. Rhomberg negative.        Assessment & Plan:  1. Lumbar postlaminectomy syndrome with chronic postoperative pain. He does have some radicular symptoms but does not wish to try another neuropathic pain  medication such as gabapentin. He has been on nortriptyline in the past.  2. Prescribed Robaxin for muscle cramps prn , patient could not tolerate the medication, he could not swallow it, and did not make a big difference anyhow. Advised patient to massage his left hip with a tennis ball, when he has cramps in this area.  Continue with exercising and walking program.  Follow up in 3 month

## 2012-12-18 NOTE — Patient Instructions (Signed)
Continue with your exercise program 

## 2013-01-23 ENCOUNTER — Telehealth: Payer: Self-pay | Admitting: *Deleted

## 2013-01-23 MED ORDER — HYDROCODONE-ACETAMINOPHEN 7.5-325 MG PO TABS: 1.0000 | ORAL_TABLET | Freq: Four times a day (QID) | ORAL | Status: DC

## 2013-01-23 NOTE — Telephone Encounter (Signed)
Refill medication. (last 12/18/12)hydrocodone.  Refilled.

## 2013-02-23 ENCOUNTER — Other Ambulatory Visit: Payer: Self-pay | Admitting: Physical Medicine and Rehabilitation

## 2013-02-26 ENCOUNTER — Encounter: Payer: Self-pay | Admitting: *Deleted

## 2013-02-26 ENCOUNTER — Telehealth: Payer: Self-pay | Admitting: *Deleted

## 2013-02-26 MED ORDER — HYDROCODONE-ACETAMINOPHEN 7.5-325 MG PO TABS: 1.0000 | ORAL_TABLET | Freq: Four times a day (QID) | ORAL | Status: DC

## 2013-02-26 NOTE — Telephone Encounter (Signed)
Refill hydrocodone. Refilled and pt notified.

## 2013-03-14 ENCOUNTER — Encounter
Payer: Medicare Other | Attending: Physical Medicine and Rehabilitation | Admitting: Physical Medicine and Rehabilitation

## 2013-03-14 ENCOUNTER — Encounter: Payer: Self-pay | Admitting: Physical Medicine and Rehabilitation

## 2013-03-14 VITALS — BP 143/61 | HR 69 | Resp 14 | Ht 74.0 in | Wt 248.0 lb

## 2013-03-14 DIAGNOSIS — G8929 Other chronic pain: Secondary | ICD-10-CM | POA: Insufficient documentation

## 2013-03-14 DIAGNOSIS — Z5181 Encounter for therapeutic drug level monitoring: Secondary | ICD-10-CM

## 2013-03-14 DIAGNOSIS — G8928 Other chronic postprocedural pain: Secondary | ICD-10-CM | POA: Insufficient documentation

## 2013-03-14 DIAGNOSIS — Z79899 Other long term (current) drug therapy: Secondary | ICD-10-CM

## 2013-03-14 DIAGNOSIS — R252 Cramp and spasm: Secondary | ICD-10-CM | POA: Insufficient documentation

## 2013-03-14 DIAGNOSIS — M961 Postlaminectomy syndrome, not elsewhere classified: Secondary | ICD-10-CM | POA: Insufficient documentation

## 2013-03-14 NOTE — Progress Notes (Signed)
Subjective:    Patient ID: Logan Briggs, male    DOB: [birth year], 57 y.o.   MRN: [id number]  HPI The patient complains about chronic low back pain which radiates into left LE, down to the lateral lower leg. The patient also complains about numbness and tingling in the same distribution.  The problem has been stable . He still does some exercises at home and walks regularly.  Hx of L4-5 fusion,2010, by Dr. Mikal Plane.  Pain Inventory Average Pain 7 Pain Right Now 7 My pain is sharp and stabbing  In the last 24 hours, has pain interfered with the following? General activity 5 Relation with others 5 Enjoyment of life 6 What TIME of day is your pain at its worst? evening and night Sleep (in general) Good  Pain is worse with: some activites Pain improves with: heat/ice and medication Relief from Meds: 7  Mobility how many minutes can you walk? 60 ability to climb steps?  yes do you drive?  yes Do you have any goals in this area?  no  Function I need assistance with the following:  household duties Do you have any goals in this area?  no  Neuro/Psych No problems in this area  Prior Studies Any changes since last visit?  no  Physicians involved in your care Any changes since last visit?  no   History reviewed. No pertinent family history. History   Social History  . Marital Status: Married    Spouse Name: N/A    Number of Children: N/A  . Years of Education: N/A   Social History Main Topics  . Smoking status: Current Every Day Smoker -- 1.50 packs/day for 20 years    Types: Cigarettes  . Smokeless tobacco: Never Used     Comment: desires to quit but starts again when upset or stressed  . Alcohol Use: Yes     Comment: only on holidays, beer  . Drug Use: No  . Sexually Active: None   Other Topics Concern  . None   Social History Narrative  . None   History reviewed. No pertinent past surgical history. Past Medical History  Diagnosis Date  . Glaucoma     BP 143/61  Pulse 69  Resp 14  Ht 6\' 2"  (1.88 m)  Wt 248 lb (112.492 kg)  BMI 31.83 kg/m2  SpO2 96%     Review of Systems  Musculoskeletal: Positive for back pain.  All other systems reviewed and are negative.       Objective:   Physical Exam Constitutional: He is oriented to person, place, and time. He appears well-developed and well-nourished.  HENT:  Head: Normocephalic.  Neck: Neck supple.  Musculoskeletal: He exhibits tenderness.  Neurological: He is alert and oriented to person, place, and time.  Skin: Skin is warm and dry.  Psychiatric: He has a normal mood and affect.  Symmetric normal motor tone is noted throughout. Normal muscle bulk. Muscle testing reveals 5/5 muscle strength of the upper extremity, and 5/5 of the lower extremity. Full range of motion in upper and lower extremities. ROM of spine is restricted. Fine motor movements are normal in both hands.  DTR in the upper and lower extremity are present and symmetric 1+. No clonus is noted.  Patient arises from chair without difficulty. Narrow based gait with normal arm swing bilateral , able to walk on heels and toes . Tandem walk is stable. No pronator drift. Rhomberg negative.        Assessment &  Plan:  1. Lumbar postlaminectomy syndrome with chronic postoperative pain.S/p PSF L4-5, 2010, by Dr. Mikal Plane  He does have some radicular symptoms but does not wish to try another neuropathic pain medication such as gabapentin. He has been on nortriptyline in the past.  2. Prescribed Robaxin for muscle cramps prn , patient could not tolerate the medication, he could not swallow it, and did not make a big difference anyhow. Advised patient to massage his left hip with a tennis ball, when he has cramps in this area.  Continue with exercising and walking program.  Follow up in 3 month

## 2013-03-14 NOTE — Patient Instructions (Signed)
Continue with your exercise and walking program 

## 2013-03-26 ENCOUNTER — Other Ambulatory Visit: Payer: Self-pay | Admitting: Physical Medicine and Rehabilitation

## 2013-03-27 ENCOUNTER — Telehealth: Payer: Self-pay

## 2013-03-27 MED ORDER — HYDROCODONE-ACETAMINOPHEN 7.5-325 MG PO TABS
1.0000 | ORAL_TABLET | Freq: Four times a day (QID) | ORAL | Status: DC
Start: 2013-03-27 — End: 2013-04-25

## 2013-03-27 NOTE — Telephone Encounter (Signed)
Patient request hydrocodone refill. 

## 2013-03-27 NOTE — Telephone Encounter (Signed)
[facility].  Patient aware.

## 2013-04-25 ENCOUNTER — Other Ambulatory Visit: Payer: Self-pay | Admitting: Physical Medicine and Rehabilitation

## 2013-05-25 ENCOUNTER — Telehealth: Payer: Self-pay | Admitting: Physical Medicine & Rehabilitation

## 2013-05-25 NOTE — Telephone Encounter (Signed)
Left message informing patient that he should have a refill of his hydrocodone at the pharmacy and he should contact them.

## 2013-05-25 NOTE — Telephone Encounter (Signed)
Patient called and will be out his hydrocodone over the weekend, please call patient.

## 2013-06-14 ENCOUNTER — Encounter: Payer: Self-pay | Admitting: Physical Medicine and Rehabilitation

## 2013-06-14 ENCOUNTER — Encounter
Payer: Medicare Other | Attending: Physical Medicine and Rehabilitation | Admitting: Physical Medicine and Rehabilitation

## 2013-06-14 VITALS — BP 124/64 | HR 81 | Resp 14 | Ht 74.0 in | Wt 248.0 lb

## 2013-06-14 DIAGNOSIS — G8928 Other chronic postprocedural pain: Secondary | ICD-10-CM | POA: Insufficient documentation

## 2013-06-14 DIAGNOSIS — M545 Low back pain, unspecified: Secondary | ICD-10-CM | POA: Insufficient documentation

## 2013-06-14 DIAGNOSIS — M79609 Pain in unspecified limb: Secondary | ICD-10-CM | POA: Insufficient documentation

## 2013-06-14 DIAGNOSIS — M961 Postlaminectomy syndrome, not elsewhere classified: Secondary | ICD-10-CM | POA: Insufficient documentation

## 2013-06-14 DIAGNOSIS — G8929 Other chronic pain: Secondary | ICD-10-CM | POA: Insufficient documentation

## 2013-06-14 DIAGNOSIS — R209 Unspecified disturbances of skin sensation: Secondary | ICD-10-CM | POA: Insufficient documentation

## 2013-06-14 MED ORDER — HYDROCODONE-ACETAMINOPHEN 7.5-325 MG PO TABS: ORAL_TABLET | ORAL | Status: DC

## 2013-06-14 MED ORDER — MELOXICAM 15 MG PO TABS: 15.0000 mg | ORAL_TABLET | Freq: Every day | ORAL | Status: DC

## 2013-06-14 NOTE — Progress Notes (Signed)
Subjective:    Patient ID: Logan Briggs, male    DOB: 01/11/1956, 57 y.o.   MRN: [id number]  HPI The patient complains about chronic low back pain which radiates into left LE, down to the lateral lower leg. The patient also complains about numbness and tingling in the same distribution.  The problem has been stable . He still does some exercises at home and walks regularly. He states, that he sometimes has increased pain and stiffness when it is raining or it gets a little colder. Hx of L4-5 fusion,2010, by Dr. Mikal Plane.  Pain Inventory Average Pain 7 Pain Right Now 7 My pain is burning and stabbing  In the last 24 hours, has pain interfered with the following? General activity 5 Relation with others 5 Enjoyment of life 6 What TIME of day is your pain at its worst? evening Sleep (in general) Good  Pain is worse with: some activites Pain improves with: heat/ice and medication Relief from Meds: 7  Mobility how many minutes can you walk? 60 ability to climb steps?  yes do you drive?  yes Do you have any goals in this area?  no  Function I need assistance with the following:  household duties Do you have any goals in this area?  no  Neuro/Psych No problems in this area  Prior Studies Any changes since last visit?  no  Physicians involved in your care Any changes since last visit?  no   History reviewed. No pertinent family history. History   Social History  . Marital Status: Married    Spouse Name: N/A    Number of Children: N/A  . Years of Education: N/A   Social History Main Topics  . Smoking status: Current Every Day Smoker -- 1.50 packs/day for 20 years    Types: Cigarettes  . Smokeless tobacco: Never Used     Comment: desires to quit but starts again when upset or stressed  . Alcohol Use: Yes     Comment: only on holidays, beer  . Drug Use: No  . Sexually Active: None   Other Topics Concern  . None   Social History Narrative  . None   History  reviewed. No pertinent past surgical history. Past Medical History  Diagnosis Date  . Glaucoma    BP 124/64  Pulse 81  Resp 14  Ht 6\' 2"  (1.88 m)  Wt 248 lb (112.492 kg)  BMI 31.83 kg/m2  SpO2 91%     Review of Systems  Musculoskeletal: Positive for back pain.  All other systems reviewed and are negative.       Objective:   Physical Exam Constitutional: He is oriented to person, place, and time. He appears well-developed and well-nourished.  HENT:  Head: Normocephalic.  Neck: Neck supple.  Musculoskeletal: He exhibits tenderness.  Neurological: He is alert and oriented to person, place, and time.  Skin: Skin is warm and dry.  Psychiatric: He has a normal mood and affect.  Symmetric normal motor tone is noted throughout. Normal muscle bulk. Muscle testing reveals 5/5 muscle strength of the upper extremity, and 5/5 of the lower extremity. Full range of motion in upper and lower extremities. ROM of spine is restricted. Fine motor movements are normal in both hands.  DTR in the upper and lower extremity are present and symmetric 1+. No clonus is noted.  Patient arises from chair without difficulty. Narrow based gait with normal arm swing bilateral , able to walk on heels and toes .  Tandem walk is stable. No pronator drift. Rhomberg negative.        Assessment & Plan:  1. Lumbar postlaminectomy syndrome with chronic postoperative pain.S/p PSF L4-5, 2010, by Dr. Mikal Plane  He does have some radicular symptoms but does not wish to try another neuropathic pain medication such as gabapentin. He has been on nortriptyline in the past.  2. Prescribed Robaxin for muscle cramps prn , patient could not tolerate the medication, he could not swallow it, and did not make a big difference anyhow. Advised patient to massage his left hip with a tennis ball, when he has cramps in this area.  #. Prescribed Mobic 15mg  for exacerbation / inflammation on cold and rainy days Continue with exercising  and walking program.  Follow up in 3 month

## 2013-06-14 NOTE — Patient Instructions (Signed)
Continue with your exercise and walking program 

## 2013-08-20 ENCOUNTER — Other Ambulatory Visit: Payer: Self-pay | Admitting: Physical Medicine and Rehabilitation

## 2013-08-28 ENCOUNTER — Ambulatory Visit (HOSPITAL_BASED_OUTPATIENT_CLINIC_OR_DEPARTMENT_OTHER): Payer: Medicare Other | Admitting: Physical Medicine & Rehabilitation

## 2013-08-28 ENCOUNTER — Encounter: Payer: Self-pay | Admitting: Physical Medicine & Rehabilitation

## 2013-08-28 ENCOUNTER — Encounter: Payer: Medicare Other | Attending: Physical Medicine and Rehabilitation

## 2013-08-28 VITALS — BP 151/77 | HR 78 | Resp 14 | Ht 74.0 in | Wt 250.0 lb

## 2013-08-28 DIAGNOSIS — M79609 Pain in unspecified limb: Secondary | ICD-10-CM | POA: Insufficient documentation

## 2013-08-28 DIAGNOSIS — G8929 Other chronic pain: Secondary | ICD-10-CM | POA: Insufficient documentation

## 2013-08-28 DIAGNOSIS — M545 Low back pain, unspecified: Secondary | ICD-10-CM | POA: Insufficient documentation

## 2013-08-28 DIAGNOSIS — R52 Pain, unspecified: Secondary | ICD-10-CM

## 2013-08-28 DIAGNOSIS — Z5181 Encounter for therapeutic drug level monitoring: Secondary | ICD-10-CM

## 2013-08-28 DIAGNOSIS — Z79899 Other long term (current) drug therapy: Secondary | ICD-10-CM

## 2013-08-28 DIAGNOSIS — G8928 Other chronic postprocedural pain: Secondary | ICD-10-CM | POA: Insufficient documentation

## 2013-08-28 DIAGNOSIS — M961 Postlaminectomy syndrome, not elsewhere classified: Secondary | ICD-10-CM

## 2013-08-28 DIAGNOSIS — R209 Unspecified disturbances of skin sensation: Secondary | ICD-10-CM | POA: Insufficient documentation

## 2013-08-28 MED ORDER — HYDROCODONE-ACETAMINOPHEN 7.5-325 MG PO TABS: ORAL_TABLET | ORAL | Status: DC

## 2013-08-28 NOTE — Progress Notes (Signed)
Subjective:    Patient ID: Logan Briggs, male    DOB: 09-13-1956, 57 y.o.   MRN: [id number]  HPI Out of medication for more than one week States that he has been taking a couple extra pain pills during the cooler weather No new pains. No falls or trauma.   Pain Inventory Average Pain 8 Pain Right Now 8 My pain is burning, tingling and aching  In the last 24 hours, has pain interfered with the following? General activity 7 Relation with others 7 Enjoyment of life 7 What TIME of day is your pain at its worst? morning, evening and night Sleep (in general) Fair  Pain is worse with: some activites Pain improves with: medication Relief from Meds: 5  Mobility walk without assistance how many minutes can you walk? 1 hour ability to climb steps?  yes do you drive?  yes Do you have any goals in this area?  no  Function I need assistance with the following:  household duties Do you have any goals in this area?  no  Neuro/Psych numbness tingling depression  Prior Studies Any changes since last visit?  no  Physicians involved in your care Any changes since last visit?  no   History reviewed. No pertinent family history. History   Social History  . Marital Status: Married    Spouse Name: N/A    Number of Children: N/A  . Years of Education: N/A   Social History Main Topics  . Smoking status: Current Every Day Smoker -- 1.50 packs/day for 20 years    Types: Cigarettes  . Smokeless tobacco: Never Used     Comment: desires to quit but starts again when upset or stressed  . Alcohol Use: Yes     Comment: only on holidays, beer  . Drug Use: No  . Sexual Activity: None   Other Topics Concern  . None   Social History Narrative  . None   History reviewed. No pertinent past surgical history. Past Medical History  Diagnosis Date  . Glaucoma    BP 151/77  Pulse 78  Resp 14  Ht 6\' 2"  (1.88 m)  Wt 250 lb (113.399 kg)  BMI 32.08 kg/m2  SpO2  93%     Review of Systems  Musculoskeletal: Positive for gait problem.  Neurological: Positive for numbness.  Psychiatric/Behavioral: Positive for dysphoric mood.       Objective:   Physical Exam  Nursing note and vitals reviewed. Constitutional: He is oriented to person, place, and time. He appears well-developed and well-nourished.  Musculoskeletal:       Lumbar back: He exhibits decreased range of motion. He exhibits no tenderness.  Negative straight leg test bilaterally  Neurological: He is alert and oriented to person, place, and time. He has normal strength and normal reflexes. Gait normal.  Psychiatric: He has a normal mood and affect.          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 Functioning Indep, no need for handicap tag., check UDS. RTC 1 mo PA clinic  2. L leg buttocks pain after sitting on wallet, likely myofascial, no sign of sciatic neuropathy, should improve over the next couple weeks  Hydrocodone office policy letter given to patient. We discussed treatment alternatives including Tylenol #3. He does not want to try this  We also discussed that he is not allow to escalate his doses without physician or PA approval. There is no new medical  issue that requires increase dosing If he runs out early again he may be discharged from clinic for violation of CSA

## 2013-08-28 NOTE — Patient Instructions (Signed)
If he did not bring pill bottle next visit, he will have to go home to get it for you get a prescription  Also, you must stick with the prescribed dosage. You may take 1 or 2 Tylenol or ibuprofen tablets per day as needed for mild to moderate pain  Not staying with the prescribed dosages May result in discharge from the clinic

## 2013-09-13 ENCOUNTER — Ambulatory Visit: Payer: Medicare Other | Admitting: Physical Medicine and Rehabilitation

## 2013-09-21 ENCOUNTER — Encounter
Payer: Medicare Other | Attending: Physical Medicine and Rehabilitation | Admitting: Physical Medicine and Rehabilitation

## 2013-09-21 ENCOUNTER — Encounter: Payer: Self-pay | Admitting: Physical Medicine and Rehabilitation

## 2013-09-21 VITALS — BP 135/78 | HR 94 | Resp 14 | Ht 72.0 in | Wt 245.6 lb

## 2013-09-21 DIAGNOSIS — M961 Postlaminectomy syndrome, not elsewhere classified: Secondary | ICD-10-CM | POA: Insufficient documentation

## 2013-09-21 DIAGNOSIS — R252 Cramp and spasm: Secondary | ICD-10-CM | POA: Insufficient documentation

## 2013-09-21 MED ORDER — HYDROCODONE-ACETAMINOPHEN 7.5-325 MG PO TABS: ORAL_TABLET | ORAL | Status: DC

## 2013-09-21 NOTE — Progress Notes (Signed)
Subjective:    Patient ID: Logan Briggs, male    DOB: 1955-12-01, 57 y.o.   MRN: [id number]  HPI The patient complains about chronic low back pain which radiates into left LE, down to the lateral lower leg. The patient also complains about numbness and tingling in the same distribution.  The problem has been stable . He still does some exercises at home and walks regularly. He states, that he sometimes has increased pain and stiffness when it is raining or it gets a little colder.  Hx of L4-5 fusion,2010, by Dr. Mikal Plane. The patient reports that he has severe tooth pain, most likely infection, he was put on Ax.  Pain Inventory Average Pain 7 Pain Right Now 7 My pain is sharp, stabbing and aching  In the last 24 hours, has pain interfered with the following? General activity 5 Relation with others 5 Enjoyment of life 6 What TIME of day is your pain at its worst? evening and night Sleep (in general) Good  Pain is worse with: some activites Pain improves with: rest, heat/ice and medication Relief from Meds: 7  Mobility walk without assistance how many minutes can you walk? 60 ability to climb steps?  yes do you drive?  yes  Function I need assistance with the following:  household duties  Neuro/Psych trouble walking  Prior Studies Any changes since last visit?  no  Physicians involved in your care Any changes since last visit?  no   History reviewed. No pertinent family history. History   Social History  . Marital Status: Married    Spouse Name: N/A    Number of Children: N/A  . Years of Education: N/A   Social History Main Topics  . Smoking status: Current Every Day Smoker -- 1.50 packs/day for 20 years    Types: Cigarettes  . Smokeless tobacco: Never Used     Comment: desires to quit but starts again when upset or stressed  . Alcohol Use: Yes     Comment: only on holidays, beer  . Drug Use: No  . Sexual Activity: None   Other Topics Concern  . None    Social History Narrative  . None   No past surgical history on file. Past Medical History  Diagnosis Date  . Glaucoma    BP 135/78 P 87  R 14  Wt 245.6lb  Ht 72"  O2 sat 94%   Review of Systems  Musculoskeletal: Positive for gait problem.  All other systems reviewed and are negative.       Objective:   Physical Exam  Constitutional: He is oriented to person, place, and time. He appears well-developed and well-nourished.  HENT:  Head: Normocephalic.  Neck: Neck supple.  Musculoskeletal: He exhibits tenderness.  Neurological: He is alert and oriented to person, place, and time.  Skin: Skin is warm and dry.  Psychiatric: He has a normal mood and affect.  Symmetric normal motor tone is noted throughout. Normal muscle bulk. Muscle testing reveals 5/5 muscle strength of the upper extremity, and 5/5 of the lower extremity. Full range of motion in upper and lower extremities. ROM of spine is restricted. Fine motor movements are normal in both hands.  DTR in the upper and lower extremity are present and symmetric 1+. No clonus is noted.  Patient arises from chair without difficulty. Narrow based gait with normal arm swing bilateral , able to walk on heels and toes . Tandem walk is stable. No pronator drift. Rhomberg negative.  Assessment & Plan:  .1. Lumbar postlaminectomy syndrome with chronic postoperative pain.S/p PSF L4-5, 2010, by Dr. Mikal Plane  He does have some radicular symptoms but does not wish to try another neuropathic pain medication such as gabapentin. He has been on nortriptyline in the past.  2. Prescribed Robaxin for muscle cramps prn , patient could not tolerate the medication, he could not swallow it, and did not make a big difference anyhow. Advised patient to massage his left hip with a tennis ball, when he has cramps in this area.  Prescribed  Mobic 15mg  for exacerbation / inflammation on cold and rainy days,when I saw him 2 month ago, patient states he  takes this prn  Continue with exercising and walking program.  Provided patient with some information about free dental clinics in this area, to get help for his tooth ache.He went to the ED and got Ax, and tramadol for about 10 days, for his tooth ache until he can see a dentist.it is questionable whether this would be a violation of his contract with [place], but he had a new severe problem, I will talk to Dr. Doroteo Bradford about it on Monday Follow up in 1 month

## 2013-09-21 NOTE — Patient Instructions (Signed)
Follow up with a dentist for your tooth pain, try to find a free dental clinic

## 2013-10-22 ENCOUNTER — Ambulatory Visit: Payer: Medicare Other | Admitting: Physical Medicine & Rehabilitation

## 2013-10-22 ENCOUNTER — Other Ambulatory Visit: Payer: Self-pay | Admitting: *Deleted

## 2013-10-22 MED ORDER — HYDROCODONE-ACETAMINOPHEN 7.5-325 MG PO TABS: ORAL_TABLET | ORAL | Status: DC

## 2013-10-22 NOTE — Telephone Encounter (Signed)
RX printed early for controlled medication for the visit with RN on 10/24/13 (to be signed by MD) 

## 2013-10-24 ENCOUNTER — Encounter: Payer: Self-pay | Admitting: *Deleted

## 2013-10-24 ENCOUNTER — Encounter: Payer: Medicare HMO | Attending: Physical Medicine & Rehabilitation | Admitting: *Deleted

## 2013-10-24 VITALS — BP 136/57 | HR 85 | Resp 14

## 2013-10-24 DIAGNOSIS — M961 Postlaminectomy syndrome, not elsewhere classified: Secondary | ICD-10-CM | POA: Insufficient documentation

## 2013-10-24 DIAGNOSIS — M5430 Sciatica, unspecified side: Secondary | ICD-10-CM

## 2013-10-24 DIAGNOSIS — R252 Cramp and spasm: Secondary | ICD-10-CM | POA: Insufficient documentation

## 2013-10-24 NOTE — Progress Notes (Signed)
Here for pill count and medication refills. Hydrocodone 7.5/325 1 q 6hr prn # 120 --Fill date 09/27/13    Today NV#19.  Pill count appropriate.  Refill given.  Pain level today 7.  No changes in pain or medication list.  Says he is "getting better". Follow up one month with RN for med refill and pill count.

## 2013-10-24 NOTE — Patient Instructions (Signed)
Follow up one month with RN for pill count and med refill 

## 2013-11-22 ENCOUNTER — Other Ambulatory Visit: Payer: Self-pay | Admitting: *Deleted

## 2013-11-22 MED ORDER — HYDROCODONE-ACETAMINOPHEN 7.5-325 MG PO TABS: 1.0000 | ORAL_TABLET | Freq: Four times a day (QID) | ORAL | Status: DC | PRN

## 2013-11-22 NOTE — Telephone Encounter (Signed)
RX printed early for controlled medication for the visit with RN on 11/27/13 (to be signed by MD)

## 2013-11-27 ENCOUNTER — Encounter: Payer: Medicare HMO | Attending: Physical Medicine & Rehabilitation | Admitting: *Deleted

## 2013-11-27 ENCOUNTER — Encounter: Payer: Self-pay | Admitting: *Deleted

## 2013-11-27 VITALS — BP 124/47 | HR 77 | Resp 14 | Wt 238.6 lb

## 2013-11-27 DIAGNOSIS — M961 Postlaminectomy syndrome, not elsewhere classified: Secondary | ICD-10-CM | POA: Insufficient documentation

## 2013-11-27 DIAGNOSIS — M5430 Sciatica, unspecified side: Secondary | ICD-10-CM | POA: Insufficient documentation

## 2013-11-27 DIAGNOSIS — Z79899 Other long term (current) drug therapy: Secondary | ICD-10-CM | POA: Insufficient documentation

## 2013-11-27 NOTE — Progress Notes (Signed)
Here for pill count and medication refills. Hydrocodone7.5/325 # 120  Today NV# 28 VSS  Pain level:7  No falls in last 6 months.  Low fall risk.  Pill count is appropriate. Refill given.  Follow up in one month

## 2013-11-27 NOTE — Patient Instructions (Signed)
Follow up in one month with Dr Letta Pate.  If no appt avail they will schedule to follow up with RN.

## 2013-12-25 ENCOUNTER — Ambulatory Visit: Payer: Medicare Other | Admitting: Physical Medicine & Rehabilitation

## 2013-12-31 ENCOUNTER — Other Ambulatory Visit: Payer: Self-pay | Admitting: *Deleted

## 2013-12-31 MED ORDER — HYDROCODONE-ACETAMINOPHEN 7.5-325 MG PO TABS: 1.0000 | ORAL_TABLET | Freq: Four times a day (QID) | ORAL | Status: DC | PRN

## 2013-12-31 NOTE — Telephone Encounter (Signed)
RX printed early for controlled medication for the visit with RN on 01/02/14 (to be signed by MD)

## 2014-01-02 ENCOUNTER — Encounter: Payer: Self-pay | Admitting: *Deleted

## 2014-01-02 ENCOUNTER — Encounter: Payer: Medicare HMO | Attending: Physical Medicine & Rehabilitation | Admitting: *Deleted

## 2014-01-02 VITALS — BP 130/56 | HR 73 | Resp 14

## 2014-01-02 DIAGNOSIS — Z79899 Other long term (current) drug therapy: Secondary | ICD-10-CM | POA: Insufficient documentation

## 2014-01-02 DIAGNOSIS — M961 Postlaminectomy syndrome, not elsewhere classified: Secondary | ICD-10-CM | POA: Insufficient documentation

## 2014-01-02 DIAGNOSIS — F17200 Nicotine dependence, unspecified, uncomplicated: Secondary | ICD-10-CM | POA: Insufficient documentation

## 2014-01-02 DIAGNOSIS — M5430 Sciatica, unspecified side: Secondary | ICD-10-CM

## 2014-01-02 DIAGNOSIS — G8928 Other chronic postprocedural pain: Secondary | ICD-10-CM | POA: Insufficient documentation

## 2014-01-02 DIAGNOSIS — M79609 Pain in unspecified limb: Secondary | ICD-10-CM | POA: Insufficient documentation

## 2014-01-02 DIAGNOSIS — M545 Low back pain, unspecified: Secondary | ICD-10-CM | POA: Insufficient documentation

## 2014-01-02 NOTE — Patient Instructions (Signed)
Follow up with Dr Letta Pate or Algis Liming PA

## 2014-01-02 NOTE — Progress Notes (Signed)
Here for pill count and medication refills.hydrocodone 7.5/325 # 120 Fill date 11/30/13   Today NV# 8     Pill count appropriate, refill given.  No falls and he is low risk.  Return in one month to clinic for refills.

## 2014-01-22 ENCOUNTER — Other Ambulatory Visit: Payer: Self-pay | Admitting: *Deleted

## 2014-01-22 MED ORDER — HYDROCODONE-ACETAMINOPHEN 7.5-325 MG PO TABS: 1.0000 | ORAL_TABLET | Freq: Four times a day (QID) | ORAL | Status: DC | PRN

## 2014-01-22 NOTE — Telephone Encounter (Signed)
RX printed for MD to sign for RN visit 01/28/14

## 2014-02-26 ENCOUNTER — Encounter: Payer: Self-pay | Admitting: Physical Medicine & Rehabilitation

## 2014-02-26 ENCOUNTER — Ambulatory Visit (HOSPITAL_BASED_OUTPATIENT_CLINIC_OR_DEPARTMENT_OTHER): Payer: Medicare HMO | Admitting: Physical Medicine & Rehabilitation

## 2014-02-26 ENCOUNTER — Encounter: Payer: Medicare HMO | Attending: Physical Medicine & Rehabilitation

## 2014-02-26 VITALS — BP 153/67 | HR 84 | Resp 14 | Ht 75.0 in | Wt 241.0 lb

## 2014-02-26 DIAGNOSIS — Z5181 Encounter for therapeutic drug level monitoring: Secondary | ICD-10-CM

## 2014-02-26 DIAGNOSIS — Z79899 Other long term (current) drug therapy: Secondary | ICD-10-CM | POA: Insufficient documentation

## 2014-02-26 DIAGNOSIS — M5430 Sciatica, unspecified side: Secondary | ICD-10-CM | POA: Insufficient documentation

## 2014-02-26 DIAGNOSIS — M961 Postlaminectomy syndrome, not elsewhere classified: Secondary | ICD-10-CM

## 2014-02-26 MED ORDER — HYDROCODONE-ACETAMINOPHEN 7.5-325 MG PO TABS: 1.0000 | ORAL_TABLET | Freq: Four times a day (QID) | ORAL | Status: DC | PRN

## 2014-02-26 NOTE — Patient Instructions (Signed)
Next visit will be with Zella Ball

## 2014-02-26 NOTE — Progress Notes (Signed)
   Subjective:    Patient ID: Logan Briggs, male    DOB: January 19, 1956, 58 y.o.   MRN: [id number]  HPI No surgeries or hospitalizations since my last visit in October  Past surgical history:  L4-5 fusion,2010, by Dr. Cyndy Freeze.  Chronic pain from back to LLE no weakness, no falls Pain Inventory Average Pain 7 Pain Right Now 7 My pain is sharp and stabbing  In the last 24 hours, has pain interfered with the following? General activity 5 Relation with others 5 Enjoyment of life 6 What TIME of day is your pain at its worst? evening,night Sleep (in general) Good  Pain is worse with: some activites Pain improves with: heat/ice and medication Relief from Meds: 7  Mobility walk without assistance how many minutes can you walk? 1 hour ability to climb steps?  yes do you drive?  yes transfers alone Do you have any goals in this area?  no  Function Do you have any goals in this area?  no  Neuro/Psych No problems in this area  Prior Studies Any changes since last visit?  no  Physicians involved in your care Any changes since last visit?  no   History reviewed. No pertinent family history. History   Social History  . Marital Status: Married    Spouse Name: N/A    Number of Children: N/A  . Years of Education: N/A   Social History Main Topics  . Smoking status: Current Every Day Smoker -- 1.50 packs/day for 20 years    Types: Cigarettes  . Smokeless tobacco: Never Used     Comment: desires to quit but starts again when upset or stressed  . Alcohol Use: Yes     Comment: only on holidays, beer  . Drug Use: No  . Sexual Activity: None   Other Topics Concern  . None   Social History Narrative  . None   History reviewed. No pertinent past surgical history. Past Medical History  Diagnosis Date  . Glaucoma    BP 153/67  Pulse 84  Resp 14  Ht 6\' 3"  (1.905 m)  Wt 241 lb (109.317 kg)  BMI 30.12 kg/m2  SpO2 98%  Opioid Risk Score:  4 Fall Risk Score:  Moderate Fall Risk (6-13 points) (pt educated and given brochure on fall risk previously)    Review of Systems  Musculoskeletal: Positive for back pain.  All other systems reviewed and are negative.      Objective:   Physical Exam  Constitutional: He is oriented to person, place, and time.  Musculoskeletal:       Lumbar back: He exhibits decreased range of motion. He exhibits no tenderness and no deformity.  Decreased lumbar flexion, extension, lateral bending  Normal sensation to pinprick bilateral L3 L4-L5 and S1 dermatomes  Neurological: He is alert and oriented to person, place, and time. He has normal strength. Gait normal.  Reflex Scores:      Patellar reflexes are 2+ on the right side and 2+ on the left side.      Achilles reflexes are 2+ on the right side and 2+ on the left side.         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 Functioning Indep, no need for handicap tag., check UDS. RTC 1 mo NP clinic

## 2014-03-01 ENCOUNTER — Ambulatory Visit: Payer: Medicare Other | Admitting: Physical Medicine & Rehabilitation

## 2014-03-04 ENCOUNTER — Other Ambulatory Visit: Payer: Self-pay | Admitting: Physical Medicine & Rehabilitation

## 2014-03-04 NOTE — Progress Notes (Signed)
Urine drug screen from 02/26/2014 was negative for tramadol.

## 2014-03-28 ENCOUNTER — Encounter: Payer: Medicare HMO | Attending: Physical Medicine & Rehabilitation | Admitting: Registered Nurse

## 2014-03-28 ENCOUNTER — Encounter: Payer: Self-pay | Admitting: Registered Nurse

## 2014-03-28 VITALS — BP 135/86 | HR 62 | Resp 14 | Ht 76.0 in | Wt 241.0 lb

## 2014-03-28 DIAGNOSIS — Z79899 Other long term (current) drug therapy: Secondary | ICD-10-CM

## 2014-03-28 DIAGNOSIS — Z5181 Encounter for therapeutic drug level monitoring: Secondary | ICD-10-CM

## 2014-03-28 DIAGNOSIS — M545 Low back pain, unspecified: Secondary | ICD-10-CM | POA: Insufficient documentation

## 2014-03-28 DIAGNOSIS — M961 Postlaminectomy syndrome, not elsewhere classified: Secondary | ICD-10-CM | POA: Insufficient documentation

## 2014-03-28 DIAGNOSIS — F17200 Nicotine dependence, unspecified, uncomplicated: Secondary | ICD-10-CM | POA: Insufficient documentation

## 2014-03-28 DIAGNOSIS — G8928 Other chronic postprocedural pain: Secondary | ICD-10-CM | POA: Insufficient documentation

## 2014-03-28 DIAGNOSIS — M79609 Pain in unspecified limb: Secondary | ICD-10-CM | POA: Insufficient documentation

## 2014-03-28 MED ORDER — HYDROCODONE-ACETAMINOPHEN 7.5-325 MG PO TABS: 1.0000 | ORAL_TABLET | Freq: Four times a day (QID) | ORAL | Status: DC | PRN

## 2014-03-28 NOTE — Progress Notes (Signed)
Subjective:    Patient ID: Logan Briggs, male    DOB: [birth year], 58 y.o.   MRN: [id number]  HPI: [name] is a 58 year old male who returns for follow up for chronic pain and medication refill. He says his pain is located in his left leg, left hip and lower back mainly on left side. He rates his pain 7. His current exercise regime is walking.  Pain Inventory Average Pain 7 Pain Right Now 7 My pain is intermittent, sharp and stabbing  In the last 24 hours, has pain interfered with the following? General activity 5 Relation with others 5 Enjoyment of life 6 What TIME of day is your pain at its worst? evening, night Sleep (in general) Good  Pain is worse with: some activites Pain improves with: heat/ice and medication Relief from Meds: 7  Mobility walk without assistance how many minutes can you walk? 1 hr ability to climb steps?  yes do you drive?  yes transfers alone Do you have any goals in this area?  no  Function Do you have any goals in this area?  no  Neuro/Psych No problems in this area  Prior Studies Any changes since last visit?  no  Physicians involved in your care Any changes since last visit?  no   History reviewed. No pertinent family history. History   Social History  . Marital Status: Married    Spouse Name: N/A    Number of Children: N/A  . Years of Education: N/A   Social History Main Topics  . Smoking status: Current Every Day Smoker -- 1.50 packs/day for 20 years    Types: Cigarettes  . Smokeless tobacco: Never Used     Comment: desires to quit but starts again when upset or stressed  . Alcohol Use: Yes     Comment: only on holidays, beer  . Drug Use: No  . Sexual Activity: None   Other Topics Concern  . None   Social History Narrative  . None   History reviewed. No pertinent past surgical history. Past Medical History  Diagnosis Date  . Glaucoma    BP 135/86  Pulse 62  Resp 14  Ht 6\' 4"  (1.93 m)  Wt 241  lb (109.317 kg)  BMI 29.35 kg/m2  SpO2 98%  Opioid Risk Score:   Fall Risk Score: Moderate Fall Risk (6-13 points) (pt educated on fall risk, brochure given to pt previously)   Review of Systems  All other systems reviewed and are negative.      Objective:   Physical Exam  Nursing note and vitals reviewed. Constitutional: He is oriented to person, place, and time. He appears well-developed and well-nourished.  HENT:  Head: Normocephalic and atraumatic.  Neck: Normal range of motion. Neck supple.  Cardiovascular: Normal rate, regular rhythm and normal heart sounds.   Pulmonary/Chest: Effort normal and breath sounds normal.  Musculoskeletal: Normal range of motion.  Normal Muscle Bulk: Muscle Testing reveals: Upper and Lower Extremities Full ROM and Muscle Strength 5/5. Back without spinal or paraspinal tenderness noted. Arises from chair with ease. Narrow based gait.  Neurological: He is alert and oriented to person, place, and time.  Skin: Skin is warm and dry.  Psychiatric: He has a normal mood and affect.          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 Refilled: Refilled: Hydrocodone 7.5/325mg  one tablet every 6 hours as needed for pain #  120 Continue with exercise therapy. 15 minutes of face to face patient care time was spent during this visit. All questions were encouraged and answered.   F/U in 1 month

## 2014-04-26 ENCOUNTER — Encounter: Payer: Medicare HMO | Attending: Physical Medicine & Rehabilitation | Admitting: Registered Nurse

## 2014-04-26 ENCOUNTER — Encounter: Payer: Self-pay | Admitting: Registered Nurse

## 2014-04-26 VITALS — BP 138/69 | HR 86 | Resp 14 | Ht 76.0 in | Wt 236.0 lb

## 2014-04-26 DIAGNOSIS — Z5181 Encounter for therapeutic drug level monitoring: Secondary | ICD-10-CM

## 2014-04-26 DIAGNOSIS — M961 Postlaminectomy syndrome, not elsewhere classified: Secondary | ICD-10-CM

## 2014-04-26 DIAGNOSIS — Z79899 Other long term (current) drug therapy: Secondary | ICD-10-CM

## 2014-04-26 DIAGNOSIS — I824Z9 Acute embolism and thrombosis of unspecified deep veins of unspecified distal lower extremity: Secondary | ICD-10-CM

## 2014-04-26 DIAGNOSIS — I824Z2 Acute embolism and thrombosis of unspecified deep veins of left distal lower extremity: Secondary | ICD-10-CM

## 2014-04-26 MED ORDER — HYDROCODONE-ACETAMINOPHEN 7.5-325 MG PO TABS: 1.0000 | ORAL_TABLET | Freq: Four times a day (QID) | ORAL | Status: DC | PRN

## 2014-04-26 NOTE — Progress Notes (Signed)
Subjective:    Patient ID: Logan Briggs, male    DOB: 05-Jul-1956, 58 y.o.   MRN: [id number]  HPI: [name] is a 58 year old male who returns for follow up for chronic pain and medication refill. He says his pain is located in his lower back and left leg. He rates his pain 7. His current exercise regime is walking daily. He says his left claf has been swollen for awhilt and complaining of pain. Will order Venous Doppler's to check for DVT, he wants this done at [facility]. Pain Inventory Average Pain 7 Pain Right Now 7 My pain is sharp and stabbing  In the last 24 hours, has pain interfered with the following? General activity 5 Relation with others 5 Enjoyment of life 6 What TIME of day is your pain at its worst? evening, night Sleep (in general) Good  Pain is worse with: some activites Pain improves with: heat/ice and medication Relief from Meds: 7  Mobility walk without assistance how many minutes can you walk? 60 ability to climb steps?  yes do you drive?  yes transfers alone Do you have any goals in this area?  no  Function disabled: date disabled na Do you have any goals in this area?  no  Neuro/Psych No problems in this area  Prior Studies Any changes since last visit?  no  Physicians involved in your care Any changes since last visit?  no   History reviewed. No pertinent family history. History   Social History  . Marital Status: Married    Spouse Name: N/A    Number of Children: N/A  . Years of Education: N/A   Social History Main Topics  . Smoking status: Current Every Day Smoker -- 1.50 packs/day for 20 years    Types: Cigarettes  . Smokeless tobacco: Never Used     Comment: desires to quit but starts again when upset or stressed  . Alcohol Use: Yes     Comment: only on holidays, beer  . Drug Use: No  . Sexual Activity: None   Other Topics Concern  . None   Social History Narrative  . None    History reviewed. No pertinent past surgical history. Past Medical History  Diagnosis Date  . Glaucoma    BP 138/69  Pulse 86  Resp 14  Ht 6\' 4"  (1.93 m)  Wt 236 lb (107.049 kg)  BMI 28.74 kg/m2  SpO2 92%  Opioid Risk Score:   Fall Risk Score: Low Fall Risk (0-5 points) (pt educated on fall risk, brochure given to pt previously)    Review of Systems  All other systems reviewed and are negative.      Objective:   Physical Exam  Nursing note and vitals reviewed. Constitutional: He is oriented to person, place, and time. He appears well-developed and well-nourished.  HENT:  Head: Normocephalic and atraumatic.  Neck: Normal range of motion. Neck supple.  Cardiovascular: Normal rate, regular rhythm and normal heart sounds.   Pulmonary/Chest: Effort normal and breath sounds normal.  Musculoskeletal:  Normal Muscle Bulk and Muscle testing Reveals: Upper extremities: Full ROM and Muscle Strength 5/5 Spine Forward Flexion 30 degrees Lumbar Paraspinal Tenderness L-3- L-5 Lower Extremities: Right Leg with Full ROM and Muscle Strength 5/5  Left Leg Full ROM, Calf edematous.  Arises from chair with ease Narrow Based Gait  Neurological: He is alert and oriented to person, place, and time.  Skin: Skin is warm  and dry.  Psychiatric: He has a normal mood and affect.          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  Refilled: Refilled: Hydrocodone 7.5/325mg  one tablet every 6 hours as needed for pain #120  Continue with exercise therapy.  2. Left Leg Pain: RX Venous Dopplers to check for DVT  15 minutes of face to face patient care time was spent during this visit. All questions were encouraged and answered.   F/U in 1 month

## 2014-05-27 ENCOUNTER — Encounter: Payer: Medicare HMO | Attending: Physical Medicine & Rehabilitation | Admitting: Registered Nurse

## 2014-05-27 ENCOUNTER — Encounter: Payer: Self-pay | Admitting: Registered Nurse

## 2014-05-27 VITALS — BP 136/68 | HR 71 | Resp 14 | Ht 76.0 in | Wt 238.0 lb

## 2014-05-27 DIAGNOSIS — Z5181 Encounter for therapeutic drug level monitoring: Secondary | ICD-10-CM

## 2014-05-27 DIAGNOSIS — I824Z9 Acute embolism and thrombosis of unspecified deep veins of unspecified distal lower extremity: Secondary | ICD-10-CM | POA: Insufficient documentation

## 2014-05-27 DIAGNOSIS — Z79899 Other long term (current) drug therapy: Secondary | ICD-10-CM

## 2014-05-27 DIAGNOSIS — M961 Postlaminectomy syndrome, not elsewhere classified: Secondary | ICD-10-CM

## 2014-05-27 MED ORDER — HYDROCODONE-ACETAMINOPHEN 7.5-325 MG PO TABS: 1.0000 | ORAL_TABLET | Freq: Four times a day (QID) | ORAL | Status: DC | PRN

## 2014-05-27 NOTE — Progress Notes (Signed)
Subjective:    Patient ID: Logan Briggs, male    DOB: [birth year], 58 y.o.   MRN: [id number]  HPI: Logan Briggs is a 58 year old male who returns for follow up for chronic pain and medication refill. He says his pain is located in his lower back and left leg. He rates his pain 5. His current exercise regime is walking and doing yard work such as cutting grass.  He says he didn't have the doppler done and doesn't plan to have test done. He has no complaints of pain in his left calf this visit.  Pain Inventory Average Pain 7 Pain Right Now 7 My pain is sharp and stabbing  In the last 24 hours, has pain interfered with the following? General activity 5 Relation with others 5 Enjoyment of life 6 What TIME of day is your pain at its worst? evening Sleep (in general) Good  Pain is worse with: some activites Pain improves with: heat/ice and medication Relief from Meds: 7  Mobility how many minutes can you walk? 60 ability to climb steps?  yes do you drive?  yes Do you have any goals in this area?  no  Function Do you have any goals in this area?  no  Neuro/Psych trouble walking  Prior Studies Any changes since last visit?  no  Physicians involved in your care Any changes since last visit?  no   History reviewed. No pertinent family history. History   Social History  . Marital Status: Married    Spouse Name: N/A    Number of Children: N/A  . Years of Education: N/A   Social History Main Topics  . Smoking status: Current Every Day Smoker -- 1.50 packs/day for 20 years    Types: Cigarettes  . Smokeless tobacco: Never Used     Comment: desires to quit but starts again when upset or stressed  . Alcohol Use: Yes     Comment: only on holidays, beer  . Drug Use: No  . Sexual Activity: None   Other Topics Concern  . None   Social History Narrative  . None   History reviewed. No pertinent past surgical history. Past Medical History  Diagnosis Date  .  Glaucoma    BP 136/68  Pulse 71  Resp 14  Ht 6\' 4"  (1.93 m)  Wt 238 lb (107.956 kg)  BMI 28.98 kg/m2  SpO2 97%  Opioid Risk Score:   Fall Risk Score: Low Fall Risk (0-5 points) (patient educated handout declined)   Review of Systems  Musculoskeletal: Positive for arthralgias, gait problem and myalgias.  All other systems reviewed and are negative.      Objective:   Physical Exam  Nursing note and vitals reviewed. Constitutional: He is oriented to person, place, and time. He appears well-developed and well-nourished.  HENT:  Head: Normocephalic and atraumatic.  Neck: Normal range of motion. Neck supple.  Cardiovascular: Normal rate, regular rhythm and normal heart sounds.   Pulmonary/Chest: Effort normal and breath sounds normal.  Musculoskeletal:  Normal Muscle Bulk and Muscle Testing Reveals: Upper Extremities: Full ROM and Muscle Strength 5/5 Spinal Forward Flexion 30 Degrees and Extension 20  Back without spinal or paraspinal tenderness Lower Extremities: Right Leg with Full ROM and Muscle Strength 5/5. Left Leg Full ROM and Muscle Strength 4/5.  Left Leg Flexion Produces Pain into Left Hip. Arises from chair with ease Narrow Based gait   Neurological: He is alert and oriented to person, place,  and time.  Skin: Skin is warm and dry.  Psychiatric: He has a normal mood and affect.          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  Refilled: Refilled: Hydrocodone 7.5/325mg  one tablet every 6 hours as needed for pain #120  Continue with exercise therapy.   15 minutes of face to face patient care time was spent during this visit. All questions were encouraged and answered.   F/U in 1 month

## 2014-06-25 ENCOUNTER — Encounter: Payer: Self-pay | Admitting: Registered Nurse

## 2014-06-25 ENCOUNTER — Encounter: Payer: Medicare HMO | Attending: Physical Medicine & Rehabilitation | Admitting: Registered Nurse

## 2014-06-25 VITALS — BP 126/81 | HR 63 | Resp 14 | Ht 75.0 in | Wt 243.0 lb

## 2014-06-25 DIAGNOSIS — Z5181 Encounter for therapeutic drug level monitoring: Secondary | ICD-10-CM

## 2014-06-25 DIAGNOSIS — I824Z9 Acute embolism and thrombosis of unspecified deep veins of unspecified distal lower extremity: Secondary | ICD-10-CM | POA: Diagnosis not present

## 2014-06-25 DIAGNOSIS — Z79899 Other long term (current) drug therapy: Secondary | ICD-10-CM

## 2014-06-25 DIAGNOSIS — M961 Postlaminectomy syndrome, not elsewhere classified: Secondary | ICD-10-CM

## 2014-06-25 MED ORDER — HYDROCODONE-ACETAMINOPHEN 7.5-325 MG PO TABS: 1.0000 | ORAL_TABLET | Freq: Four times a day (QID) | ORAL | Status: DC | PRN

## 2014-06-25 NOTE — Progress Notes (Signed)
Subjective:    Patient ID: Logan Briggs, male    DOB: 1956-04-05, 58 y.o.   MRN: [id number]  HPI: [name] is a 58 year old male who returns for follow up for chronic pain and medication refill. He says his pain is located in his lower back and left leg. He rates his pain 7. His current exercise regime is walking and yard work.  Pain Inventory Average Pain 7 Pain Right Now 7 My pain is sharp and stabbing  In the last 24 hours, has pain interfered with the following? General activity 5 Relation with others 5 Enjoyment of life 6 What TIME of day is your pain at its worst? evening, night Sleep (in general) Good  Pain is worse with: some activites Pain improves with: heat/ice and medication Relief from Meds: 7  Mobility walk without assistance how many minutes can you walk? 60 ability to climb steps?  yes do you drive?  yes transfers alone Do you have any goals in this area?  no  Function Do you have any goals in this area?  no  Neuro/Psych No problems in this area  Prior Studies Any changes since last visit?  no  Physicians involved in your care Any changes since last visit?  no   History reviewed. No pertinent family history. History   Social History  . Marital Status: Married    Spouse Name: N/A    Number of Children: N/A  . Years of Education: N/A   Social History Main Topics  . Smoking status: Current Every Day Smoker -- 1.50 packs/day for 20 years    Types: Cigarettes  . Smokeless tobacco: Never Used     Comment: desires to quit but starts again when upset or stressed  . Alcohol Use: Yes     Comment: only on holidays, beer  . Drug Use: No  . Sexual Activity: None   Other Topics Concern  . None   Social History Narrative  . None   History reviewed. No pertinent past surgical history. Past Medical History  Diagnosis Date  . Glaucoma    BP 126/81  Pulse 63  Resp 14  Ht 6\' 3"  (1.905 m)  Wt 243 lb (110.224 kg)  BMI 30.37 kg/m2   SpO2 92%  Opioid Risk Score:   Fall Risk Score: Moderate Fall Risk (6-13 points) (pt educated, declined brochure)    Review of Systems  All other systems reviewed and are negative.      Objective:   Physical Exam  Nursing note and vitals reviewed. Constitutional: He is oriented to person, place, and time. He appears well-developed and well-nourished.  HENT:  Head: Normocephalic and atraumatic.  Neck: Normal range of motion. Neck supple.  Cardiovascular: Normal rate and regular rhythm.   Pulmonary/Chest: Effort normal and breath sounds normal.  Musculoskeletal:  Normal Muscle Bulk and Muscle testing Reveals: Upper Extremities: Full ROM and Muscle strength 5/5 Back without Spinal or Paraspinal Tenderness Lower Extremities: Full ROM and Muscle strength 5/5 Arises from chair with ease Narrow Based gait  Neurological: He is alert and oriented to person, place, and time.  Skin: Skin is warm and dry.  Psychiatric: He has a normal mood and affect.          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  Refilled:Hydrocodone 7.5/325mg  one tablet every 6 hours as needed for pain #120  Continue with exercise therapy.   15 minutes of face to  face patient care time was spent during this visit. All questions were encouraged and answered.   F/U in 1 month

## 2014-07-23 ENCOUNTER — Encounter: Payer: Self-pay | Admitting: Registered Nurse

## 2014-07-23 ENCOUNTER — Encounter: Payer: Medicare HMO | Attending: Physical Medicine & Rehabilitation | Admitting: Registered Nurse

## 2014-07-23 VITALS — BP 145/76 | HR 85 | Resp 14 | Wt 240.0 lb

## 2014-07-23 DIAGNOSIS — Z5181 Encounter for therapeutic drug level monitoring: Secondary | ICD-10-CM

## 2014-07-23 DIAGNOSIS — I824Z9 Acute embolism and thrombosis of unspecified deep veins of unspecified distal lower extremity: Secondary | ICD-10-CM | POA: Diagnosis present

## 2014-07-23 DIAGNOSIS — Z79899 Other long term (current) drug therapy: Secondary | ICD-10-CM

## 2014-07-23 DIAGNOSIS — M961 Postlaminectomy syndrome, not elsewhere classified: Secondary | ICD-10-CM

## 2014-07-23 MED ORDER — HYDROCODONE-ACETAMINOPHEN 7.5-325 MG PO TABS: 1.0000 | ORAL_TABLET | Freq: Four times a day (QID) | ORAL | Status: DC | PRN

## 2014-07-23 NOTE — Progress Notes (Signed)
Subjective:    Patient ID: Logan Briggs, male    DOB: 09-23-1956, 58 y.o.   MRN: [id number]  HPI: Logan Briggs is a 58 year old male who returns for follow up for chronic pain and medication refill. He says usually his pain is located in his lower back and left leg. He denies any pain at this time. When he was doing his pain tool  he rated his pain 7. His current exercise regime is walking and yard work.  Pain Inventory Average Pain 7 Pain Right Now 7 My pain is sharp and stabbing  In the last 24 hours, has pain interfered with the following? General activity 5 Relation with others 5 Enjoyment of life 6 What TIME of day is your pain at its worst? evening and night Sleep (in general) Good  Pain is worse with: some activites Pain improves with: heat/ice and medication Relief from Meds: 7  Mobility how many minutes can you walk? 60 ability to climb steps?  yes do you drive?  yes  Function Do you have any goals in this area?  no  Neuro/Psych No problems in this area  Prior Studies Any changes since last visit?  no  Physicians involved in your care Any changes since last visit?  no   History reviewed. No pertinent family history. History   Social History  . Marital Status: Married    Spouse Name: N/A    Number of Children: N/A  . Years of Education: N/A   Social History Main Topics  . Smoking status: Current Every Day Smoker -- 1.50 packs/day for 20 years    Types: Cigarettes  . Smokeless tobacco: Never Used     Comment: desires to quit but starts again when upset or stressed  . Alcohol Use: Yes     Comment: only on holidays, beer  . Drug Use: No  . Sexual Activity: None   Other Topics Concern  . None   Social History Narrative  . None   History reviewed. No pertinent past surgical history. Past Medical History  Diagnosis Date  . Glaucoma    BP 145/76  Pulse 85  Resp 14  Wt 240 lb (108.863 kg)  SpO2 93%  Opioid Risk Score:   Fall  Risk Score: Low Fall Risk (0-5 points) (previoulsy educated and declined handout)   Review of Systems  All other systems reviewed and are negative.      Objective:   Physical Exam  Nursing note and vitals reviewed. Constitutional: He is oriented to person, place, and time. He appears well-developed and well-nourished.  HENT:  Head: Normocephalic and atraumatic.  Neck: Normal range of motion. Neck supple.  Cardiovascular: Normal rate and regular rhythm.   Pulmonary/Chest: Effort normal and breath sounds normal.  Musculoskeletal:  Normal Muscle Bulk and Muscle Testing Reveals: Upper Extremities: Full ROM and Muscle strength 5/5 Back without spinal or paraspinal tenderness Lower Extremities: Full ROM and Muscle strength 5/5 Arises from chair with ease Narrow Based Gait  Neurological: He is alert and oriented to person, place, and time.  Skin: Skin is warm.  Psychiatric: He has a normal mood and affect.          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  Refilled:Hydrocodone 7.5/325mg  one tablet every 6 hours as needed for pain #120  Continue with exercise therapy.   15 minutes of face to face patient care time was spent during this visit. All  questions were encouraged and answered.   F/U in 1 month

## 2014-08-20 ENCOUNTER — Encounter: Payer: Medicare HMO | Attending: Physical Medicine & Rehabilitation | Admitting: Registered Nurse

## 2014-08-20 ENCOUNTER — Encounter: Payer: Self-pay | Admitting: Registered Nurse

## 2014-08-20 VITALS — BP 120/56 | HR 68 | Resp 14 | Ht 76.0 in | Wt 240.0 lb

## 2014-08-20 DIAGNOSIS — G894 Chronic pain syndrome: Secondary | ICD-10-CM | POA: Diagnosis present

## 2014-08-20 DIAGNOSIS — G609 Hereditary and idiopathic neuropathy, unspecified: Secondary | ICD-10-CM

## 2014-08-20 DIAGNOSIS — Z5181 Encounter for therapeutic drug level monitoring: Secondary | ICD-10-CM | POA: Insufficient documentation

## 2014-08-20 DIAGNOSIS — Z79899 Other long term (current) drug therapy: Secondary | ICD-10-CM | POA: Diagnosis not present

## 2014-08-20 DIAGNOSIS — M961 Postlaminectomy syndrome, not elsewhere classified: Secondary | ICD-10-CM

## 2014-08-20 MED ORDER — HYDROCODONE-ACETAMINOPHEN 7.5-325 MG PO TABS: 1.0000 | ORAL_TABLET | Freq: Four times a day (QID) | ORAL | Status: DC | PRN

## 2014-08-20 MED ORDER — GABAPENTIN 100 MG PO CAPS: 100.0000 mg | ORAL_CAPSULE | Freq: Three times a day (TID) | ORAL | Status: DC

## 2014-08-20 NOTE — Progress Notes (Signed)
   Subjective:    Patient ID: Logan Briggs, male    DOB: 04-Sep-1956, 58 y.o.   MRN: [id number]  HPI: [name] is a 58 year old male who returns for follow up for chronic pain and medication refill. He says his pain is located in his lower back and radiating into his left leg. He rates his pain 7. His current exercise regime is walking.  Pain Inventory Average Pain 7 Pain Right Now 7 My pain is constant, sharp and stabbing  In the last 24 hours, has pain interfered with the following? General activity 5 Relation with others 5 Enjoyment of life 6 What TIME of day is your pain at its worst? evening and night Sleep (in general) Good  Pain is worse with: some activites Pain improves with: heat/ice and medication Relief from Meds: 7  Mobility walk without assistance how many minutes can you walk? 1 hour ability to climb steps?  yes do you drive?  yes  Function disabled: date disabled .  Neuro/Psych No problems in this area  Prior Studies no  Physicians involved in your care Any changes since last visit?  no   History reviewed. No pertinent family history. History   Social History  . Marital Status: Married    Spouse Name: N/A    Number of Children: N/A  . Years of Education: N/A   Social History Main Topics  . Smoking status: Current Every Day Smoker -- 1.50 packs/day for 20 years    Types: Cigarettes  . Smokeless tobacco: Never Used     Comment: desires to quit but starts again when upset or stressed  . Alcohol Use: Yes     Comment: only on holidays, beer  . Drug Use: No  . Sexual Activity: None   Other Topics Concern  . None   Social History Narrative  . None   History reviewed. No pertinent past surgical history. Past Medical History  Diagnosis Date  . Glaucoma    BP 120/56  Pulse 68  Resp 14  Ht 6\' 4"  (1.93 m)  Wt 240 lb (108.863 kg)  BMI 29.23 kg/m2  SpO2 99%  Opioid Risk Score:   Fall Risk Score: Low Fall Risk (0-5  points)  Review of Systems     Objective:   Physical Exam  Nursing note and vitals reviewed. Constitutional: He is oriented to person, place, and time. He appears well-developed and well-nourished.  HENT:  Head: Normocephalic and atraumatic.  Neck: Normal range of motion. Neck supple.  Cardiovascular: Normal rate and regular rhythm.   Pulmonary/Chest: Effort normal and breath sounds normal.  Musculoskeletal:  Normal Muscle Bulk and Muscle testing Reveals: Upper Extremities: Full ROM and Muscle strength 5/5 Back without spinal or Paraspinal Tenderness Lower Extremities: Full ROM and Muscle Strength 5/5 Arises from chair with ease Narrow Based gait  Neurological: He is alert and oriented to person, place, and time.  Skin: Skin is warm and dry.  Psychiatric: He has a normal mood and affect.          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  Refilled:Hydrocodone 7.5/325mg  one tablet every 6 hours as needed for pain #120  Continue with exercise regime. 2. Peripheral Neuropathy: RX: Gabapentin: Use as directed  15 minutes of face to face patient care time was spent during this visit. All questions were encouraged and answered.  F/U in 1 month

## 2014-08-20 NOTE — Patient Instructions (Signed)
Gabapentin Instructions:  Take one capsule at Bedtime only for one week.  If the pain Persists: Increase the Gabapentin to two capsules daily: One in the Morning and one at Bedtime: For one Week  If Pain Persists  Increase Gabapentin to the times a day.  Call office with any questions or concerns

## 2014-09-17 ENCOUNTER — Encounter: Payer: Medicare HMO | Attending: Physical Medicine & Rehabilitation | Admitting: Registered Nurse

## 2014-09-17 ENCOUNTER — Ambulatory Visit: Payer: Medicare HMO | Admitting: Registered Nurse

## 2014-09-17 ENCOUNTER — Encounter: Payer: Self-pay | Admitting: Registered Nurse

## 2014-09-17 VITALS — BP 135/67 | HR 97 | Resp 16 | Ht 76.0 in | Wt 242.0 lb

## 2014-09-17 DIAGNOSIS — Z79899 Other long term (current) drug therapy: Secondary | ICD-10-CM | POA: Diagnosis present

## 2014-09-17 DIAGNOSIS — Z5181 Encounter for therapeutic drug level monitoring: Secondary | ICD-10-CM | POA: Diagnosis present

## 2014-09-17 DIAGNOSIS — M961 Postlaminectomy syndrome, not elsewhere classified: Secondary | ICD-10-CM

## 2014-09-17 DIAGNOSIS — G894 Chronic pain syndrome: Secondary | ICD-10-CM | POA: Insufficient documentation

## 2014-09-17 DIAGNOSIS — M62838 Other muscle spasm: Secondary | ICD-10-CM

## 2014-09-17 DIAGNOSIS — G609 Hereditary and idiopathic neuropathy, unspecified: Secondary | ICD-10-CM

## 2014-09-17 MED ORDER — METHOCARBAMOL 500 MG PO TABS: 500.0000 mg | ORAL_TABLET | Freq: Four times a day (QID) | ORAL | Status: DC | PRN

## 2014-09-17 MED ORDER — HYDROCODONE-ACETAMINOPHEN 7.5-325 MG PO TABS: 1.0000 | ORAL_TABLET | Freq: Four times a day (QID) | ORAL | Status: DC | PRN

## 2014-09-17 NOTE — Progress Notes (Signed)
Subjective:    Patient ID: Logan Briggs, male    DOB: May 14, 1956, 57 y.o.   MRN: [id number]  HPI: [name] is a 58 year old male who returns for follow up for chronic pain and medication refill. He says his pain is located in his left hip and left leg. He says he has been having frequent muscle spasms. He rates his pain 7. His current exercise regime is walking, household chores and yard work.  Pain Inventory Average Pain 7 Pain Right Now 7 My pain is sharp and stabbing  In the last 24 hours, has pain interfered with the following? General activity 5 Relation with others 5 Enjoyment of life 6 What TIME of day is your pain at its worst? evening,night Sleep (in general) Good  Pain is worse with: some activites Pain improves with: heat/ice and medication Relief from Meds: 7  Mobility walk without assistance how many minutes can you walk? 60 ability to climb steps?  yes do you drive?  yes Do you have any goals in this area?  no  Function disabled: date disabled unsure I need assistance with the following:  household duties Do you have any goals in this area?  no  Neuro/Psych No problems in this area  Prior Studies Any changes since last visit?  no bone scan x-rays CT/MRI nerve study  Physicians involved in your care Any changes since last visit?  no   History reviewed. No pertinent family history. History   Social History  . Marital Status: Married    Spouse Name: N/A    Number of Children: N/A  . Years of Education: N/A   Social History Main Topics  . Smoking status: Current Every Day Smoker -- 1.50 packs/day for 20 years    Types: Cigarettes  . Smokeless tobacco: Never Used     Comment: desires to quit but starts again when upset or stressed  . Alcohol Use: Yes     Comment: only on holidays, beer  . Drug Use: No  . Sexual Activity: None   Other Topics Concern  . None   Social History Narrative   History reviewed. No pertinent past  surgical history. Past Medical History  Diagnosis Date  . Glaucoma    BP 135/67 mmHg  Pulse 97  Resp 16  Ht 6\' 4"  (1.93 m)  Wt 242 lb (109.77 kg)  BMI 29.47 kg/m2  SpO2 98%  Opioid Risk Score:   Fall Risk Score: Low Fall Risk (0-5 points)  Review of Systems  Musculoskeletal: Positive for back pain.  All other systems reviewed and are negative.      Objective:   Physical Exam  Constitutional: He is oriented to person, place, and time. He appears well-developed and well-nourished.  HENT:  Head: Normocephalic and atraumatic.  Neck: Normal range of motion. Neck supple.  Cardiovascular: Normal rate and regular rhythm.   Pulmonary/Chest: Effort normal and breath sounds normal.  Musculoskeletal:  Normal Muscle Bulk and Muscle Testing Reveals: Upper extremities: Full ROM and Muscle strength 5/5 Spinal Forward Flexion 45 Degrees and extension 20 Degrees Left Gluteal Maximus Tenderness Lower extremities: Full ROM and Muscle strength 5/5 Arises from chair with ease Narrow based gait  Neurological: He is alert and oriented to person, place, and time.  Skin: Skin is warm and dry.  Psychiatric: He has a normal mood and affect.  Nursing note and vitals reviewed.         Assessment & Plan:  1.Lumbar post  lami syndrome s/p L4-5 lami and fusion.  Refilled:Hydrocodone 7.5/325mg  one tablet every 6 hours as needed for pain #120  Continue with exercise regime. 2. Peripheral Neuropathy: RX: Gabapentin: Use as directed 3. Muscle Spasm: RX: Robaxin 500 mg Q6H prn #60 15 minutes of face to face patient care time was spent during this visit. All questions were encouraged and answered.  F/U in 1 month

## 2014-10-15 ENCOUNTER — Encounter: Payer: Self-pay | Admitting: Registered Nurse

## 2014-10-15 ENCOUNTER — Encounter: Payer: Medicare HMO | Attending: Physical Medicine & Rehabilitation | Admitting: Registered Nurse

## 2014-10-15 VITALS — BP 154/69 | HR 95 | Resp 14 | Wt 240.0 lb

## 2014-10-15 DIAGNOSIS — Z79899 Other long term (current) drug therapy: Secondary | ICD-10-CM | POA: Insufficient documentation

## 2014-10-15 DIAGNOSIS — G894 Chronic pain syndrome: Secondary | ICD-10-CM | POA: Diagnosis present

## 2014-10-15 DIAGNOSIS — Z5181 Encounter for therapeutic drug level monitoring: Secondary | ICD-10-CM | POA: Diagnosis present

## 2014-10-15 DIAGNOSIS — G609 Hereditary and idiopathic neuropathy, unspecified: Secondary | ICD-10-CM

## 2014-10-15 DIAGNOSIS — M961 Postlaminectomy syndrome, not elsewhere classified: Secondary | ICD-10-CM

## 2014-10-15 MED ORDER — HYDROCODONE-ACETAMINOPHEN 7.5-325 MG PO TABS: 1.0000 | ORAL_TABLET | Freq: Four times a day (QID) | ORAL | Status: DC | PRN

## 2014-10-15 NOTE — Progress Notes (Signed)
Subjective:    Patient ID: Logan Briggs, male    DOB: 26-Aug-1956, 58 y.o.   MRN: [id number]  HPI: [name] is a 58 year old male who returns for follow up for chronic pain and medication refill. He says his pain is located in his lower back and left leg. He rates his pain 7. His current exercise regime is walking, household chores and yard work.  Pain Inventory Average Pain 7 Pain Right Now 7 My pain is sharp and stabbing  In the last 24 hours, has pain interfered with the following? General activity 5 Relation with others 5 Enjoyment of life 6 What TIME of day is your pain at its worst? evening and night Sleep (in general) NA  Pain is worse with: some activites Pain improves with: heat/ice and medication Relief from Meds: 7  Mobility how many minutes can you walk? 60 ability to climb steps?  yes do you drive?  yes  Function I need assistance with the following:  household duties  Neuro/Psych No problems in this area  Prior Studies Any changes since last visit?  no  Physicians involved in your care Any changes since last visit?  no   History reviewed. No pertinent family history. History   Social History  . Marital Status: Married    Spouse Name: N/A    Number of Children: N/A  . Years of Education: N/A   Social History Main Topics  . Smoking status: Current Every Day Smoker -- 1.50 packs/day for 20 years    Types: Cigarettes  . Smokeless tobacco: Never Used     Comment: desires to quit but starts again when upset or stressed  . Alcohol Use: Yes     Comment: only on holidays, beer  . Drug Use: No  . Sexual Activity: None   Other Topics Concern  . None   Social History Narrative   History reviewed. No pertinent past surgical history. Past Medical History  Diagnosis Date  . Glaucoma    BP 154/69 mmHg  Pulse 95  Resp 14  Wt 240 lb (108.863 kg)  SpO2 97%  Opioid Risk Score:   Fall Risk Score: Low Fall Risk (0-5 points)  (previously educated and given handout)  Review of Systems  All other systems reviewed and are negative.      Objective:   Physical Exam  Constitutional: He is oriented to person, place, and time. He appears well-developed and well-nourished.  HENT:  Head: Normocephalic and atraumatic.  Neck: Normal range of motion. Neck supple.  Cardiovascular: Normal rate and regular rhythm.   Pulmonary/Chest: Effort normal and breath sounds normal.  Musculoskeletal:  Normal Muscle Bulk and Muscle testing Reveals: Upper Extremities: Full ROM and Muscle strength 5/5 Back without Spinal or Paraspinal Tenderness Lower Extremities: Full ROM and Muscle Strength 5/5 Arises from chair with ease Narrow Based gait  Neurological: He is alert and oriented to person, place, and time.  Skin: Skin is warm and dry.  Psychiatric: He has a normal mood and affect.  Nursing note and vitals reviewed.         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  Refilled:Hydrocodone 7.5/325mg  one tablet every 6 hours as needed for pain #120  Continue with exercise regime. 2. Peripheral Neuropathy: Continue: Gabapentin: Use as directed 3. Muscle Spasm: No Complaints Today: Continue Robaxin 500 mg Q6H prn #60  15 minutes of face to face patient care time was spent  during this visit. All questions were encouraged and answered.  F/U in 1 month

## 2014-11-15 ENCOUNTER — Other Ambulatory Visit: Payer: Self-pay | Admitting: Physical Medicine & Rehabilitation

## 2014-11-15 ENCOUNTER — Encounter: Payer: Self-pay | Admitting: Registered Nurse

## 2014-11-15 ENCOUNTER — Encounter: Payer: Medicare HMO | Attending: Physical Medicine & Rehabilitation | Admitting: Registered Nurse

## 2014-11-15 VITALS — BP 143/56 | HR 70 | Resp 18

## 2014-11-15 DIAGNOSIS — Z79899 Other long term (current) drug therapy: Secondary | ICD-10-CM | POA: Diagnosis not present

## 2014-11-15 DIAGNOSIS — M961 Postlaminectomy syndrome, not elsewhere classified: Secondary | ICD-10-CM

## 2014-11-15 DIAGNOSIS — Z5181 Encounter for therapeutic drug level monitoring: Secondary | ICD-10-CM | POA: Diagnosis present

## 2014-11-15 DIAGNOSIS — G894 Chronic pain syndrome: Secondary | ICD-10-CM | POA: Diagnosis present

## 2014-11-15 MED ORDER — HYDROCODONE-ACETAMINOPHEN 7.5-325 MG PO TABS: 1.0000 | ORAL_TABLET | Freq: Four times a day (QID) | ORAL | Status: DC | PRN

## 2014-11-15 NOTE — Progress Notes (Signed)
Subjective:    Patient ID: Logan Briggs, male    DOB: [birth year], 59 y.o.   MRN: [id number]  HPI: [name] is a 59 year old male who returns for follow up for chronic pain and medication refill. He says his pain is located in his lower back and left leg. He rates his pain 7. His current exercise regime is walking and yard work.  Pain Inventory Average Pain 7 Pain Right Now 7 My pain is sharp and stabbing  In the last 24 hours, has pain interfered with the following? General activity 5 Relation with others 5 Enjoyment of life 6 What TIME of day is your pain at its worst? evening, night Sleep (in general) Fair  Pain is worse with: some activites Pain improves with: heat/ice and medication Relief from Meds: 7  Mobility how many minutes can you walk? 60 ability to climb steps?  yes do you drive?  yes Do you have any goals in this area?  no  Function Do you have any goals in this area?  no  Neuro/Psych No problems in this area  Prior Studies Any changes since last visit?  no  Physicians involved in your care Any changes since last visit?  no   History reviewed. No pertinent family history. History   Social History  . Marital Status: Married    Spouse Name: N/A    Number of Children: N/A  . Years of Education: N/A   Social History Main Topics  . Smoking status: Current Every Day Smoker -- 1.50 packs/day for 20 years    Types: Cigarettes  . Smokeless tobacco: Never Used     Comment: desires to quit but starts again when upset or stressed  . Alcohol Use: Yes     Comment: only on holidays, beer  . Drug Use: No  . Sexual Activity: None   Other Topics Concern  . None   Social History Narrative   History reviewed. No pertinent past surgical history. Past Medical History  Diagnosis Date  . Glaucoma    BP 143/56 mmHg  Pulse 70  Resp 18  SpO2 99%  Opioid Risk Score:   Fall Risk Score: Low Fall Risk (0-5 points) Review of Systems  All  other systems reviewed and are negative.      Objective:   Physical Exam  Constitutional: He is oriented to person, place, and time. He appears well-developed and well-nourished.  HENT:  Head: Normocephalic and atraumatic.  Neck: Normal range of motion. Neck supple.  Cardiovascular: Normal rate and regular rhythm.   Musculoskeletal:  Normal Muscle Bulk and Muscle testing Reveals: Upper extremities: Full ROM and Muscle strength 5/5 Back without spinal or paraspinal tenderness Lower extremities: Full ROM and Muscle Strength 5/5 Arises from chair with ease Narrow based gait  Neurological: He is alert and oriented to person, place, and time.  Skin: Skin is warm and dry.  Psychiatric: He has a normal mood and affect.  Nursing note and vitals reviewed.         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  Refilled:Hydrocodone 7.5/325mg  one tablet every 6 hours as needed for pain #120  Continue with exercise regime. 2. Peripheral Neuropathy: Continue: Gabapentin: Use as directed 3. Muscle Spasm: No Complaints Today: Continue Robaxin 500 mg Q6H prn  15 minutes of face to face patient care time was spent during this visit. All questions were encouraged and answered.  F/U in 1  month

## 2014-11-16 LAB — PMP ALCOHOL METABOLITE (ETG): Ethyl Glucuronide (EtG): NEGATIVE ng/mL

## 2014-11-18 LAB — OPIATES/OPIOIDS (LC/MS-MS)
Codeine Urine: NEGATIVE ng/mL (ref <50)
HYDROCODONE: 398 ng/mL (ref <50)
HYDROMORPHONE: 120 ng/mL (ref <50)
Morphine Urine: NEGATIVE ng/mL (ref <50)
Norhydrocodone, Ur: 349 ng/mL (ref <50)
Noroxycodone, Ur: NEGATIVE ng/mL (ref <50)
Oxycodone, ur: NEGATIVE ng/mL (ref <50)
Oxymorphone: NEGATIVE ng/mL (ref <50)

## 2014-11-19 LAB — PRESCRIPTION MONITORING PROFILE (SOLSTAS)
Amphetamine/Meth: NEGATIVE ng/mL
BENZODIAZEPINE SCREEN, URINE: NEGATIVE ng/mL
BUPRENORPHINE, URINE: NEGATIVE ng/mL
Barbiturate Screen, Urine: NEGATIVE ng/mL
CANNABINOID SCRN UR: NEGATIVE ng/mL
Carisoprodol, Urine: NEGATIVE ng/mL
Cocaine Metabolites: NEGATIVE ng/mL
Creatinine, Urine: 69.03 mg/dL (ref >20.0)
Fentanyl, Ur: NEGATIVE ng/mL
MDMA URINE: NEGATIVE ng/mL
MEPERIDINE UR: NEGATIVE ng/mL
METHADONE SCREEN, URINE: NEGATIVE ng/mL
NITRITES URINE, INITIAL: NEGATIVE ug/mL
OXYCODONE SCRN UR: NEGATIVE ng/mL
Propoxyphene: NEGATIVE ng/mL
Tapentadol, urine: NEGATIVE ng/mL
Tramadol Scrn, Ur: NEGATIVE ng/mL
Zolpidem, Urine: NEGATIVE ng/mL
pH, Initial: 6 pH (ref 4.5–8.9)

## 2014-11-25 NOTE — Progress Notes (Signed)
Urine drug screen for this encounter is consistent for prescribed medication 

## 2014-12-13 ENCOUNTER — Encounter: Payer: Medicare HMO | Attending: Physical Medicine & Rehabilitation | Admitting: Registered Nurse

## 2014-12-13 ENCOUNTER — Encounter: Payer: Self-pay | Admitting: Registered Nurse

## 2014-12-13 VITALS — BP 134/78 | HR 74 | Resp 14

## 2014-12-13 DIAGNOSIS — Z79899 Other long term (current) drug therapy: Secondary | ICD-10-CM | POA: Diagnosis not present

## 2014-12-13 DIAGNOSIS — G894 Chronic pain syndrome: Secondary | ICD-10-CM | POA: Insufficient documentation

## 2014-12-13 DIAGNOSIS — M961 Postlaminectomy syndrome, not elsewhere classified: Secondary | ICD-10-CM

## 2014-12-13 DIAGNOSIS — Z5181 Encounter for therapeutic drug level monitoring: Secondary | ICD-10-CM | POA: Insufficient documentation

## 2014-12-13 DIAGNOSIS — M542 Cervicalgia: Secondary | ICD-10-CM

## 2014-12-13 MED ORDER — HYDROCODONE-ACETAMINOPHEN 7.5-325 MG PO TABS: 1.0000 | ORAL_TABLET | Freq: Four times a day (QID) | ORAL | Status: DC | PRN

## 2014-12-13 NOTE — Progress Notes (Signed)
Subjective:    Patient ID: Logan Briggs, male    DOB: 05-03-1956, 59 y.o.   MRN: [id number]  HPI: [name] is a 59 year old male who returns for follow up for chronic pain and medication refill. He says his pain is located in his neck, lower back and left leg. He rates his pain 8. His current exercise regime is walking and performing stretching exercises.  Pain Inventory Average Pain 8 Pain Right Now 8 My pain is sharp and stabbing  In the last 24 hours, has pain interfered with the following? General activity 5 Relation with others 5 Enjoyment of life 6 What TIME of day is your pain at its worst? night Sleep (in general) Fair  Pain is worse with: some activites Pain improves with: heat/ice and medication Relief from Meds: 7  Mobility walk without assistance how many minutes can you walk? 60 ability to climb steps?  yes do you drive?  yes Do you have any goals in this area?  no  Function not employed: date last employed 11/30/2004 disabled: date disabled 09/2004 Do you have any goals in this area?  no  Neuro/Psych No problems in this area  Prior Studies Any changes since last visit?  no  Physicians involved in your care Any changes since last visit?  no   History reviewed. No pertinent family history. History   Social History  . Marital Status: Married    Spouse Name: N/A    Number of Children: N/A  . Years of Education: N/A   Social History Main Topics  . Smoking status: Current Every Day Smoker -- 1.50 packs/day for 20 years    Types: Cigarettes  . Smokeless tobacco: Never Used     Comment: desires to quit but starts again when upset or stressed  . Alcohol Use: Yes     Comment: only on holidays, beer  . Drug Use: No  . Sexual Activity: None   Other Topics Concern  . None   Social History Narrative   History reviewed. No pertinent past surgical history. Past Medical History  Diagnosis Date  . Glaucoma    BP 134/78 mmHg  Pulse  74  Resp 14  SpO2 98%  Opioid Risk Score:   Fall Risk Score: Low Fall Risk (0-5 points) (pt declined pamphlet during todays visit citing no need)  Review of Systems  All other systems reviewed and are negative.      Objective:   Physical Exam  Constitutional: He is oriented to person, place, and time. He appears well-developed and well-nourished.  HENT:  Head: Normocephalic and atraumatic.  Neck: Normal range of motion. Neck supple.  Cervical Paraspinal Tenderness: C-3- C-5 Mainly Left Side  Cardiovascular: Normal rate and regular rhythm.   Pulmonary/Chest: Effort normal and breath sounds normal.  Musculoskeletal:  Normal Muscle Bulk and Muscle Testing Reveals: Upper Extremities: Full ROM and Muscle Strength 5/5 Back without Spinal or Paraspinal Tenderness Lower Extremities: Full ROM and Muscle strength 5/5 Arises from chair with ease Narrow Based Gait   Neurological: He is alert and oriented to person, place, and time.  Skin: Skin is warm and dry.  Psychiatric: He has a normal mood and affect.  Nursing note and vitals reviewed.         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  Refilled:Hydrocodone 7.5/325mg  one tablet every 6 hours as needed for pain #120  Continue with exercise regime. 2. Peripheral Neuropathy:  Continue: Gabapentin: Use as directed 3. Muscle Spasm: No Complaints Today: Continue Robaxin 500 mg Q6H prn  15 minutes of face to face patient care time was spent during this visit. All questions were encouraged and answered.  F/U in 1 month

## 2015-01-10 ENCOUNTER — Encounter: Payer: Medicare HMO | Attending: Physical Medicine & Rehabilitation | Admitting: Registered Nurse

## 2015-01-10 ENCOUNTER — Encounter: Payer: Self-pay | Admitting: Registered Nurse

## 2015-01-10 VITALS — BP 144/62 | HR 74 | Resp 14

## 2015-01-10 DIAGNOSIS — G894 Chronic pain syndrome: Secondary | ICD-10-CM | POA: Insufficient documentation

## 2015-01-10 DIAGNOSIS — Z5181 Encounter for therapeutic drug level monitoring: Secondary | ICD-10-CM | POA: Insufficient documentation

## 2015-01-10 DIAGNOSIS — Z79899 Other long term (current) drug therapy: Secondary | ICD-10-CM | POA: Insufficient documentation

## 2015-01-10 DIAGNOSIS — M961 Postlaminectomy syndrome, not elsewhere classified: Secondary | ICD-10-CM

## 2015-01-10 DIAGNOSIS — M25552 Pain in left hip: Secondary | ICD-10-CM

## 2015-01-10 MED ORDER — HYDROCODONE-ACETAMINOPHEN 7.5-325 MG PO TABS: 1.0000 | ORAL_TABLET | Freq: Four times a day (QID) | ORAL | Status: DC | PRN

## 2015-01-10 NOTE — Progress Notes (Signed)
Subjective:    Patient ID: Logan Briggs, male    DOB: 1956-07-01, 59 y.o.   MRN: [id number]  HPI: [name] is a 59 year old male who returns for follow up for chronic pain and medication refill. He says his pain is located in his lower back and left hip. He rates his pain 7. His current exercise regime is walking and performing stretching exercises.   Pain Inventory Average Pain 7 Pain Right Now 7 My pain is constant, sharp and stabbing  In the last 24 hours, has pain interfered with the following? General activity 5 Relation with others 5 Enjoyment of life 5 What TIME of day is your pain at its worst? evening and night Sleep (in general) Fair  Pain is worse with: some activites Pain improves with: heat/ice and medication Relief from Meds: 7  Mobility walk without assistance how many minutes can you walk? 60 ability to climb steps?  yes do you drive?  yes  Function disabled: date disabled .  Neuro/Psych No problems in this area  Prior Studies Any changes since last visit?  no  Physicians involved in your care Any changes since last visit?  no   History reviewed. No pertinent family history. History   Social History  . Marital Status: Married    Spouse Name: N/A  . Number of Children: N/A  . Years of Education: N/A   Social History Main Topics  . Smoking status: Current Every Day Smoker -- 1.50 packs/day for 20 years    Types: Cigarettes  . Smokeless tobacco: Never Used     Comment: desires to quit but starts again when upset or stressed  . Alcohol Use: Yes     Comment: only on holidays, beer  . Drug Use: No  . Sexual Activity: Not on file   Other Topics Concern  . None   Social History Narrative   History reviewed. No pertinent past surgical history. Past Medical History  Diagnosis Date  . Glaucoma    BP 144/62 mmHg  Pulse 74  Resp 14  SpO2 98%  Opioid Risk Score:   Fall Risk Score: Low Fall Risk (0-5 points)  Review of  Systems  Constitutional: Negative.   HENT: Negative.   Eyes: Negative.   Respiratory: Negative.   Cardiovascular: Negative.   Gastrointestinal: Negative.   Endocrine: Negative.   Genitourinary: Negative.   Musculoskeletal:       Left leg pain  Allergic/Immunologic: Negative.   Neurological: Negative.   Hematological: Negative.   Psychiatric/Behavioral: Negative.        Objective:   Physical Exam  Constitutional: He is oriented to person, place, and time. He appears well-developed and well-nourished.  HENT:  Head: Normocephalic and atraumatic.  Neck: Normal range of motion. Neck supple.  Cardiovascular: Normal rate and regular rhythm.   Pulmonary/Chest: Effort normal and breath sounds normal.  Musculoskeletal:  Normal Muscle Bulk and Muscle Testing Reveals: Upper Extremities: Full ROM and Muscle strength 5/5 Back without spinal or Paraspinal Tenderness Lower Extremities: Full ROM and Muscle strength 5/5 Arises from chair with ease Narrow Based Gait   Neurological: He is alert and oriented to person, place, and time.  Skin: Skin is warm and dry.  Psychiatric: He has a normal mood and affect.  Nursing note and vitals reviewed.         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  Refilled:Hydrocodone 7.5/325mg  one tablet every 6 hours as  needed for pain #120  Continue with exercise regime. 2. Peripheral Neuropathy: Continue: Gabapentin: Use as directed 3. Muscle Spasm: No Complaints Today: Continue Robaxin 500 mg Q6H prn  15 minutes of face to face patient care time was spent during this visit. All questions were encouraged and answered.  F/U in 1 month

## 2015-02-10 ENCOUNTER — Ambulatory Visit: Payer: Medicare HMO | Admitting: Physical Medicine & Rehabilitation

## 2015-02-10 ENCOUNTER — Encounter: Payer: Medicare HMO | Attending: Physical Medicine & Rehabilitation

## 2015-02-10 DIAGNOSIS — Z5181 Encounter for therapeutic drug level monitoring: Secondary | ICD-10-CM | POA: Insufficient documentation

## 2015-02-10 DIAGNOSIS — Z79899 Other long term (current) drug therapy: Secondary | ICD-10-CM | POA: Insufficient documentation

## 2015-02-10 DIAGNOSIS — G894 Chronic pain syndrome: Secondary | ICD-10-CM | POA: Insufficient documentation

## 2015-02-14 ENCOUNTER — Ambulatory Visit (HOSPITAL_BASED_OUTPATIENT_CLINIC_OR_DEPARTMENT_OTHER): Payer: Medicare HMO | Admitting: Physical Medicine & Rehabilitation

## 2015-02-14 ENCOUNTER — Encounter: Payer: Self-pay | Admitting: Physical Medicine & Rehabilitation

## 2015-02-14 VITALS — BP 140/66 | HR 70 | Resp 14

## 2015-02-14 DIAGNOSIS — G894 Chronic pain syndrome: Secondary | ICD-10-CM | POA: Diagnosis present

## 2015-02-14 DIAGNOSIS — M961 Postlaminectomy syndrome, not elsewhere classified: Secondary | ICD-10-CM

## 2015-02-14 DIAGNOSIS — Z79899 Other long term (current) drug therapy: Secondary | ICD-10-CM | POA: Diagnosis present

## 2015-02-14 DIAGNOSIS — Z5181 Encounter for therapeutic drug level monitoring: Secondary | ICD-10-CM | POA: Diagnosis present

## 2015-02-14 DIAGNOSIS — M5432 Sciatica, left side: Secondary | ICD-10-CM | POA: Diagnosis not present

## 2015-02-14 MED ORDER — HYDROCODONE-ACETAMINOPHEN 7.5-325 MG PO TABS: 1.0000 | ORAL_TABLET | Freq: Four times a day (QID) | ORAL | Status: DC | PRN

## 2015-02-14 NOTE — Progress Notes (Signed)
Subjective:    Patient ID: Logan Briggs, male    DOB: [birth year], 59 y.o.   MRN: [id number] Past surgical history:  L4-5 fusion,2010, by Dr. Cyndy Freeze.  HPI   Independent with all activity, uses a push mower and a riding mower. No new medications started by other MDs except for a urine infection No bowel or bladder issues Pain Inventory Average Pain 7 Pain Right Now 7 My pain is constant, sharp and stabbing  In the last 24 hours, has pain interfered with the following? General activity 5 Relation with others 5 Enjoyment of life 6 What TIME of day is your pain at its worst? evening and night  Sleep (in general) Poor  Pain is worse with: walking, standing and some activites Pain improves with: heat/ice and medication Relief from Meds: 7  Mobility walk without assistance how many minutes can you walk? 20 ability to climb steps?  yes do you drive?  yes  Function disabled: date disabled .  Neuro/Psych weakness numbness trouble walking  Prior Studies Any changes since last visit?  no  Physicians involved in your care Any changes since last visit?  no   History reviewed. No pertinent family history. History   Social History  . Marital Status: Married    Spouse Name: N/A  . Number of Children: N/A  . Years of Education: N/A   Social History Main Topics  . Smoking status: Current Every Day Smoker -- 1.50 packs/day for 20 years    Types: Cigarettes  . Smokeless tobacco: Never Used     Comment: desires to quit but starts again when upset or stressed  . Alcohol Use: Yes     Comment: only on holidays, beer  . Drug Use: No  . Sexual Activity: Not on file   Other Topics Concern  . None   Social History Narrative   History reviewed. No pertinent past surgical history. Past Medical History  Diagnosis Date  . Glaucoma    BP 140/66 mmHg  Pulse 70  Resp 14  SpO2 98%  Opioid Risk Score:   Fall Risk Score: Low Fall Risk (0-5 points)`1  Depression  screen PHQ 2/9  No flowsheet data found.    Review of Systems  HENT: Negative.   Eyes: Negative.   Respiratory: Negative.   Cardiovascular: Negative.   Gastrointestinal: Negative.   Endocrine: Negative.   Genitourinary: Negative.   Musculoskeletal: Positive for myalgias and arthralgias.       Left leg and hip pain  Allergic/Immunologic: Negative.   Neurological: Positive for weakness and numbness.       Trouble walking  Hematological: Negative.   Psychiatric/Behavioral: Negative.        Objective:   Physical Exam  Constitutional: He is oriented to person, place, and time. He appears well-developed and well-nourished.  HENT:  Head: Normocephalic and atraumatic.  Neurological: He is alert and oriented to person, place, and time. He has normal strength.  Reflex Scores:      Patellar reflexes are 2+ on the right side and 0 on the left side. 5/5 strength bilateral hip flexor and knee extensor ankle dorsiflexor plantar flexor  Light touch sensation intact bilateral lower extremities  Psychiatric: He has a normal mood and affect.  Nursing note and vitals reviewed.         Assessment & Plan:  1. Lumbar postlaminectomy syndrome status post L4-L5 fusion in 2010. Functioning independent. No signs of narcotic analgesic misuse. We discussed alcohol use that he should  not use alcohol in combination with these strong pain medications. Continue Hydrocodone 7.5 mg 4 times a day  NP visit 1 month M.D. Every 6-12 months  Continue opioid monitoring program. This consists of regular clinic visits, examinations, urine drug screen, pill counts as well as use of [place] controlled substance reporting System.

## 2015-02-14 NOTE — Patient Instructions (Addendum)
Alcohol in combination with hydrocodone can cause falls, breathing difficulties and increased chance of overdose. Do not drink alcohol as long as you are on the hydrocodone.

## 2015-03-10 ENCOUNTER — Ambulatory Visit (HOSPITAL_BASED_OUTPATIENT_CLINIC_OR_DEPARTMENT_OTHER): Payer: Medicare HMO | Admitting: Physical Medicine & Rehabilitation

## 2015-03-10 ENCOUNTER — Encounter: Payer: Self-pay | Admitting: Physical Medicine & Rehabilitation

## 2015-03-10 ENCOUNTER — Encounter: Payer: Medicare Other | Attending: Physical Medicine & Rehabilitation

## 2015-03-10 VITALS — BP 127/75 | HR 68 | Resp 14

## 2015-03-10 DIAGNOSIS — Z5181 Encounter for therapeutic drug level monitoring: Secondary | ICD-10-CM | POA: Insufficient documentation

## 2015-03-10 DIAGNOSIS — Z79899 Other long term (current) drug therapy: Secondary | ICD-10-CM | POA: Insufficient documentation

## 2015-03-10 DIAGNOSIS — G894 Chronic pain syndrome: Secondary | ICD-10-CM | POA: Insufficient documentation

## 2015-03-10 DIAGNOSIS — M5416 Radiculopathy, lumbar region: Secondary | ICD-10-CM | POA: Diagnosis not present

## 2015-03-10 DIAGNOSIS — M961 Postlaminectomy syndrome, not elsewhere classified: Secondary | ICD-10-CM | POA: Diagnosis not present

## 2015-03-10 MED ORDER — GABAPENTIN 100 MG PO CAPS: 100.0000 mg | ORAL_CAPSULE | Freq: Three times a day (TID) | ORAL | Status: DC

## 2015-03-10 MED ORDER — HYDROCODONE-ACETAMINOPHEN 7.5-325 MG PO TABS: 1.0000 | ORAL_TABLET | Freq: Four times a day (QID) | ORAL | Status: DC | PRN

## 2015-03-10 NOTE — Patient Instructions (Signed)
Please take the gabapentin 3 times per day every day. This is for the pain shooting down her left leg which I think is related to scarring around the left L4 or left L5 nerve root

## 2015-03-10 NOTE — Progress Notes (Signed)
Subjective:    Patient ID: Logan Briggs, male    DOB: Mar 23, 1956, 59 y.o.   MRN: [id number]  HPI Patient states these had increasing left lower extremity pain and numbness.  He has had no falls no new trauma. The pain and numbness is in the same location as it previously has been this is along the left lateral thigh as well as left lateral leg. No weakness of lower extremity no bowel or bladder dysfunction Patient has had gabapentin 100 g 3 times a day prescribed in the past last prescription was in October with 2 refills, He is not been taken them recently In addition he denies any back spasms in the past he has used Robaxin but has not needed to use his medicine for quite some time.    Pain Inventory Average Pain 7 Pain Right Now 7 My pain is sharp and stabbing  In the last 24 hours, has pain interfered with the following? General activity 5 Relation with others 5 Enjoyment of life 6 What TIME of day is your pain at its worst? evening, night Sleep (in general) Good  Pain is worse with: some activites Pain improves with: heat/ice and medication Relief from Meds: 7  Mobility walk without assistance how many minutes can you walk? 60 ability to climb steps?  yes do you drive?  yes Do you have any goals in this area?  no  Function not employed: date last employed 11/30/2004 disabled: date disabled 10/06/2004 I need assistance with the following:  household duties Do you have any goals in this area?  no  Neuro/Psych No problems in this area  Prior Studies Any changes since last visit?  no  Physicians involved in your care Any changes since last visit?  no   History reviewed. No pertinent family history. History   Social History  . Marital Status: Married    Spouse Name: N/A  . Number of Children: N/A  . Years of Education: N/A   Social History Main Topics  . Smoking status: Current Every Day Smoker -- 1.50 packs/day for 20 years    Types: Cigarettes  .  Smokeless tobacco: Never Used     Comment: desires to quit but starts again when upset or stressed  . Alcohol Use: Yes     Comment: only on holidays, beer  . Drug Use: No  . Sexual Activity: Not on file   Other Topics Concern  . None   Social History Narrative   History reviewed. No pertinent past surgical history. Past Medical History  Diagnosis Date  . Glaucoma    BP 127/75 mmHg  Pulse 68  Resp 14  SpO2 98%  Opioid Risk Score:   Fall Risk Score: Moderate Fall Risk (6-13 points) (patient previously educated)`1  Depression screen PHQ 2/9  No flowsheet data found.   Review of Systems  All other systems reviewed and are negative.      Objective:   Physical Exam  Constitutional: He appears well-developed and well-nourished.  HENT:  Head: Normocephalic and atraumatic.  Psychiatric: He has a normal mood and affect.  Nursing note and vitals reviewed.  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  Refilled:Hydrocodone 7.5/'325mg'$  one tablet every 6 hours as needed for  pain #120  Continue with exercise regime. Continue opioid monitoring program. This consists of regular clinic visits, examinations, urine drug screen, pill counts as well as use of [place] controlled substance reporting System. 2. 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 We will restart at 100 mg 3 times a day but may need to titrate upward from there 3. Muscle Spasm: No Complaints Today:He is taking Robaxin on a when necessary basis and has not needed to take these for a month or 2

## 2015-03-17 ENCOUNTER — Ambulatory Visit: Payer: Medicare HMO | Admitting: Physical Medicine & Rehabilitation

## 2015-04-03 ENCOUNTER — Encounter: Payer: Self-pay | Admitting: Registered Nurse

## 2015-04-03 ENCOUNTER — Encounter (HOSPITAL_BASED_OUTPATIENT_CLINIC_OR_DEPARTMENT_OTHER): Payer: Medicare Other | Admitting: Registered Nurse

## 2015-04-03 VITALS — BP 140/72 | HR 78 | Resp 14

## 2015-04-03 DIAGNOSIS — M62838 Other muscle spasm: Secondary | ICD-10-CM

## 2015-04-03 DIAGNOSIS — Z79899 Other long term (current) drug therapy: Secondary | ICD-10-CM

## 2015-04-03 DIAGNOSIS — M961 Postlaminectomy syndrome, not elsewhere classified: Secondary | ICD-10-CM | POA: Diagnosis not present

## 2015-04-03 DIAGNOSIS — Z5181 Encounter for therapeutic drug level monitoring: Secondary | ICD-10-CM | POA: Diagnosis not present

## 2015-04-03 DIAGNOSIS — M5416 Radiculopathy, lumbar region: Secondary | ICD-10-CM

## 2015-04-03 MED ORDER — BACLOFEN 10 MG PO TABS: 10.0000 mg | ORAL_TABLET | Freq: Three times a day (TID) | ORAL | Status: DC

## 2015-04-03 MED ORDER — HYDROCODONE-ACETAMINOPHEN 7.5-325 MG PO TABS: 1.0000 | ORAL_TABLET | Freq: Four times a day (QID) | ORAL | Status: DC | PRN

## 2015-04-03 MED ORDER — GABAPENTIN 100 MG PO CAPS: 100.0000 mg | ORAL_CAPSULE | Freq: Three times a day (TID) | ORAL | Status: DC

## 2015-04-03 NOTE — Progress Notes (Signed)
Subjective:    Patient ID: Logan Briggs, male    DOB: [birth year], 59 y.o.   MRN: [id number]  HPI: [name] is a 59 year old male who returns for follow up for chronic pain and medication refill. He says his pain is located in his lower back radiating into his left hip and left lower extremity. He wasn't able to fill his gabapentin due to financial hardship and insurance complication. Insurance matter resolved he will be able to pick up his gabapentin today. New order placed he verbalizes understanding.Also states his muscle spasm's has increased in frequency minimal relief from robaxin. Robaxin discontinued and Baclofen ordered he verbalizes understanding. He rates his pain 7. His current exercise regime is walking,performing stretching exercises and yard work.   Pain Inventory Average Pain 7 Pain Right Now 7 My pain is intermittent, constant, sharp, burning, stabbing and aching  In the last 24 hours, has pain interfered with the following? General activity 5 Relation with others 5 Enjoyment of life 6 What TIME of day is your pain at its worst? evening and night  Sleep (in general) Good  Pain is worse with: walking, standing and some activites Pain improves with: rest and medication Relief from Meds: 7  Mobility walk without assistance how many minutes can you walk? 60 ability to climb steps?  yes do you drive?  yes  Function disabled: date disabled .  Neuro/Psych numbness tingling spasms  Prior Studies Any changes since last visit?  no  Physicians involved in your care Any changes since last visit?  no   History reviewed. No pertinent family history. History   Social History  . Marital Status: Married    Spouse Name: N/A  . Number of Children: N/A  . Years of Education: N/A   Social History Main Topics  . Smoking status: Current Every Day Smoker -- 1.50 packs/day for 20 years    Types: Cigarettes  . Smokeless tobacco: Never Used     Comment:  desires to quit but starts again when upset or stressed  . Alcohol Use: Yes     Comment: only on holidays, beer  . Drug Use: No  . Sexual Activity: Not on file   Other Topics Concern  . None   Social History Narrative   History reviewed. No pertinent past surgical history. Past Medical History  Diagnosis Date  . Glaucoma    BP 140/72 mmHg  Pulse 78  Resp 14  SpO2 97%  Opioid Risk Score:   Fall Risk Score: Low Fall Risk (0-5 points)`1  Depression screen PHQ 2/9  No flowsheet data found.   Review of Systems  Constitutional: Negative.   HENT: Negative.   Eyes: Negative.   Respiratory: Negative.   Cardiovascular: Negative.   Gastrointestinal: Negative.   Endocrine: Negative.   Genitourinary: Negative.   Musculoskeletal: Positive for myalgias, back pain and arthralgias.  Skin: Negative.   Allergic/Immunologic: Negative.   Neurological:       Neuropathic pain in left leg  Hematological: Negative.   Psychiatric/Behavioral: Negative.        Objective:   Physical Exam  Constitutional: He is oriented to person, place, and time. He appears well-developed and well-nourished.  HENT:  Head: Normocephalic and atraumatic.  Neck: Normal range of motion. Neck supple.  Cardiovascular: Normal rate and regular rhythm.   Pulmonary/Chest: Effort normal and breath sounds normal.  Musculoskeletal:  Normal Muscle Bulk and Muscle Testing Reveals: Upper Extremities: Full ROM and Muscle Strength 5/5  Back without spinal or paraspinal tenderness Lower Extremities: Full ROM and Muscle strength 5/5 Arises from chair with ease  Narrow Based Gait  Neurological: He is alert and oriented to person, place, and time.  Skin: Skin is warm and dry.  Psychiatric: He has a normal mood and affect.  Nursing note and vitals reviewed.         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  Refilled:Hydrocodone 7.5/'325mg'$  one tablet every 6 hours as needed for  pain #120  Continue with exercise regime. 2. Lumbar Radiculopathy: RX: Gabapentin:  3. Muscle Spasm: TID  15 minutes of face to face patient care time was spent during this visit. All questions were encouraged and answered.  F/U in 1 month

## 2015-05-07 ENCOUNTER — Encounter: Payer: Self-pay | Admitting: Registered Nurse

## 2015-05-07 ENCOUNTER — Encounter: Payer: Medicare Other | Attending: Physical Medicine & Rehabilitation | Admitting: Registered Nurse

## 2015-05-07 VITALS — BP 142/78 | HR 64 | Resp 14

## 2015-05-07 DIAGNOSIS — G894 Chronic pain syndrome: Secondary | ICD-10-CM | POA: Insufficient documentation

## 2015-05-07 DIAGNOSIS — M961 Postlaminectomy syndrome, not elsewhere classified: Secondary | ICD-10-CM

## 2015-05-07 DIAGNOSIS — Z5181 Encounter for therapeutic drug level monitoring: Secondary | ICD-10-CM

## 2015-05-07 DIAGNOSIS — Z79899 Other long term (current) drug therapy: Secondary | ICD-10-CM | POA: Diagnosis present

## 2015-05-07 DIAGNOSIS — M5432 Sciatica, left side: Secondary | ICD-10-CM

## 2015-05-07 DIAGNOSIS — M5416 Radiculopathy, lumbar region: Secondary | ICD-10-CM

## 2015-05-07 MED ORDER — GABAPENTIN 100 MG PO CAPS: 100.0000 mg | ORAL_CAPSULE | Freq: Three times a day (TID) | ORAL | Status: DC

## 2015-05-07 MED ORDER — HYDROCODONE-ACETAMINOPHEN 7.5-325 MG PO TABS: 1.0000 | ORAL_TABLET | Freq: Four times a day (QID) | ORAL | Status: DC | PRN

## 2015-05-07 NOTE — Progress Notes (Signed)
Subjective:    Patient ID: Logan Briggs, male    DOB: 01-06-1956, 59 y.o.   MRN: [id number]  HPI: [name] is a 59 year old male who returns for follow up for chronic pain and medication refill. He says his pain is located in his lower back radiating into his left buttock. He rates his pain 7. His current exercise regime is walking in his home. He went to [facility] 05/01/15 for Leg Pain he was prescribed a medrol dose pak. He stopped his gabapentin educated on medication compliance and resume Gabapentin as instructed he verbalizes understanding. He will call office in two weeks to evaluate effectiveness.   Pain Inventory Average Pain 7 Pain Right Now 7 My pain is intermittent, sharp, stabbing, aching and stingin  In the last 24 hours, has pain interfered with the following? General activity 5 Relation with others 5 Enjoyment of life 5 What TIME of day is your pain at its worst? evening and night  Sleep (in general) Fair  Pain is worse with: walking, bending, standing and some activites Pain improves with: rest and medication Relief from Meds: 7  Mobility walk without assistance how many minutes can you walk? 60 ability to climb steps?  yes do you drive?  yes  Function disabled: date disabled .  Neuro/Psych tingling trouble walking  Prior Studies Any changes since last visit?  no  Physicians involved in your care Any changes since last visit?  no   History reviewed. No pertinent family history. History   Social History  . Marital Status: Married    Spouse Name: N/A  . Number of Children: N/A  . Years of Education: N/A   Social History Main Topics  . Smoking status: Current Every Day Smoker -- 1.50 packs/day for 20 years    Types: Cigarettes  . Smokeless tobacco: Never Used     Comment: desires to quit but starts again when upset or stressed  . Alcohol Use: Yes     Comment: only on holidays, beer  . Drug Use: No  . Sexual Activity:  Not on file   Other Topics Concern  . None   Social History Narrative   History reviewed. No pertinent past surgical history. Past Medical History  Diagnosis Date  . Glaucoma    BP 142/78 mmHg  Pulse 64  Resp 14  SpO2 98%  Opioid Risk Score:   Fall Risk Score: Low Fall Risk (0-5 points)`1  Depression screen PHQ 2/9  No flowsheet data found.   Review of Systems  Constitutional: Negative.   HENT: Negative.   Eyes: Negative.   Respiratory: Negative.   Cardiovascular: Negative.   Gastrointestinal: Negative.   Endocrine: Negative.   Genitourinary: Negative.   Musculoskeletal:       Left hip pain radiating into leg down to foot  Skin: Negative.   Allergic/Immunologic: Negative.   Neurological:       Stinging ache  Hematological: Negative.   Psychiatric/Behavioral: Negative.        Objective:   Physical Exam  Constitutional: He is oriented to person, place, and time. He appears well-developed and well-nourished.  HENT:  Head: Normocephalic and atraumatic.  Neck: Normal range of motion. Neck supple.  Cardiovascular: Normal rate and regular rhythm.   Pulmonary/Chest: Effort normal and breath sounds normal.  Musculoskeletal:  Normal Muscle Bulk and Muscle Testing Reveals: Upper Extremities: Full ROM and Muscle Strength 5/5 Back without spinal or paraspinal tenderness Sacral Tenderness ( Left) S1  Lower Extremities: Full ROM and Muscle Strength 5/5 Arises from chair with ease Narrow Based gait  Neurological: He is alert and oriented to person, place, and time.  Skin: Skin is warm and dry.  Psychiatric: He has a normal mood and affect.  Nursing note and vitals reviewed.         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  Refilled:Hydrocodone 7.5/'325mg'$  one tablet every 6 hours as needed for pain #120  Continue with exercise regime. 2. Lumbar Radiculopathy: Resumje Gabapentin:  3. Muscle Spasm: Continue Baclofen  20  minutes of face to face patient care time was spent during this visit. All questions were encouraged and answered.   F/U in 1 month

## 2015-05-07 NOTE — Patient Instructions (Signed)
Take Gabapentin as prescribed one capsule three times a day: Try for a week  The following week if pain persists   Take two capsules three times a day  If pain Persists   Take Three Capsules three times a day   Call the office during the second week to evaluate effectiveness

## 2015-05-19 ENCOUNTER — Telehealth: Payer: Self-pay | Admitting: *Deleted

## 2015-05-19 MED ORDER — GABAPENTIN 300 MG PO CAPS: 300.0000 mg | ORAL_CAPSULE | Freq: Three times a day (TID) | ORAL | Status: DC

## 2015-05-19 NOTE — Telephone Encounter (Signed)
Change gabapentin to '300mg'$  TID #90 May see me this week to re eval

## 2015-05-19 NOTE — Telephone Encounter (Signed)
Logan Briggs called because he is in a great deal of pain.  He has pain down his left leg.  He is not able to sleep or do simple things like wash dishes.  The medication Zella Ball ordered for him is not doing any good (?gabapentin) and he is not wanting to have an injection.  He said he went to his PCP and they said he needs an MRI probably.  He would like to have that done, and in the meantime is there anything that he can take to relieve this pain?

## 2015-05-19 NOTE — Telephone Encounter (Signed)
Instructed him to take what he has('100mg'$ ) 3 capsules tid until they are gone and then he can pick up the 300 mg capsule tid after the old dose is gone.  He will come in to see Dr Letta Pate Thursday 7/14 16 for a 10 am appt. For revaluation.

## 2015-05-22 ENCOUNTER — Encounter: Payer: Medicare Other | Attending: Physical Medicine & Rehabilitation

## 2015-05-22 ENCOUNTER — Ambulatory Visit (HOSPITAL_BASED_OUTPATIENT_CLINIC_OR_DEPARTMENT_OTHER): Payer: Medicare Other | Admitting: Physical Medicine & Rehabilitation

## 2015-05-22 ENCOUNTER — Encounter: Payer: Self-pay | Admitting: Physical Medicine & Rehabilitation

## 2015-05-22 VITALS — BP 136/68 | HR 91 | Resp 16

## 2015-05-22 DIAGNOSIS — M5416 Radiculopathy, lumbar region: Secondary | ICD-10-CM | POA: Diagnosis not present

## 2015-05-22 DIAGNOSIS — G894 Chronic pain syndrome: Secondary | ICD-10-CM | POA: Diagnosis present

## 2015-05-22 DIAGNOSIS — Z79899 Other long term (current) drug therapy: Secondary | ICD-10-CM | POA: Diagnosis not present

## 2015-05-22 DIAGNOSIS — M961 Postlaminectomy syndrome, not elsewhere classified: Secondary | ICD-10-CM | POA: Diagnosis not present

## 2015-05-22 DIAGNOSIS — Z5181 Encounter for therapeutic drug level monitoring: Secondary | ICD-10-CM | POA: Insufficient documentation

## 2015-05-22 DIAGNOSIS — M25552 Pain in left hip: Secondary | ICD-10-CM

## 2015-05-22 MED ORDER — OXYCODONE-ACETAMINOPHEN 7.5-325 MG PO TABS: 1.0000 | ORAL_TABLET | Freq: Four times a day (QID) | ORAL | Status: DC | PRN

## 2015-05-22 MED ORDER — GABAPENTIN 400 MG PO CAPS: 400.0000 mg | ORAL_CAPSULE | Freq: Three times a day (TID) | ORAL | Status: DC

## 2015-05-22 NOTE — Patient Instructions (Signed)
Will make a referral to Dr. Cyndy Freeze. He can make the decision whether or not to get an MRI of your back or a CT myelogram. Your last myelogram was 2007. I suspect that you've had some worsening of some of the other levels in your spine since that time.

## 2015-05-22 NOTE — Progress Notes (Signed)
Subjective:    Patient ID: Logan Briggs, male    DOB: [birth year], 59 y.o.   MRN: [id number]  HPI Logan Briggs called 05/19/15 about increased pain in back and down his left leg.  His Gabapentin was increased to 300 mg tid from 100 mg tid.  He says that this is not helping at all. He has taken more of his hydrocodone than expected. The patient has a history of chronic L4 radiculopathy following L4-L5 laminotomy several years ago. His surgeon was Dr. Cyndy Freeze. He has not seen Dr. Cyndy Freeze in recent years. We reviewed his last CT myelogram from 2007 which demonstrated epidural fibrosis which would explain his chronic left L4 radiculopathy. He states that his pain has intensified however although he cannot clearly state that it has changed in terms of location Pain is worse with walking some improvement with sitting. Patient also has some groin pain which similarly increases with walking and reduces with sitting Pain Inventory Average Pain 7 Pain Right Now 7 My pain is sharp and stabbing  In the last 24 hours, has pain interfered with the following? General activity 6 Relation with others 6 Enjoyment of life 7 What TIME of day is your pain at its worst? evening and night Sleep (in general) Poor  Pain is worse with: some activites Pain improves with: heat/ice and medication Relief from Meds: 7  Mobility walk without assistance how many minutes can you walk? 60 ability to climb steps?  yes do you drive?  yes  Function Do you have any goals in this area?  no  Neuro/Psych trouble walking  Prior Studies Any changes since last visit?  no  Physicians involved in your care Any changes since last visit?  no   History reviewed. No pertinent family history. History   Social History  . Marital Status: Married    Spouse Name: N/A  . Number of Children: N/A  . Years of Education: N/A   Social History Main Topics  . Smoking status: Current Every Day Smoker -- 1.50 packs/day for 20  years    Types: Cigarettes  . Smokeless tobacco: Never Used     Comment: desires to quit but starts again when upset or stressed  . Alcohol Use: Yes     Comment: only on holidays, beer  . Drug Use: No  . Sexual Activity: Not on file   Other Topics Concern  . None   Social History Narrative   History reviewed. No pertinent past surgical history. Past Medical History  Diagnosis Date  . Glaucoma    BP 136/68 mmHg  Pulse 91  Resp 16  SpO2 97%  Opioid Risk Score:   Fall Risk Score: Moderate Fall Risk (6-13 points) (previously educated and given handout)`1  Depression screen PHQ 2/9  Depression screen PHQ 2/9 05/22/2015  Decreased Interest 0  Down, Depressed, Hopeless 0  PHQ - 2 Score 0  Altered sleeping 3  Tired, decreased energy 0  Change in appetite 0  Feeling bad or failure about yourself  3  Trouble concentrating 0  Moving slowly or fidgety/restless 3  Suicidal thoughts 0  PHQ-9 Score 9     Review of Systems  Musculoskeletal: Positive for back pain.       Left leg pain  All other systems reviewed and are negative.      Objective:   Physical Exam  Constitutional: He appears well-developed and well-nourished.  HENT:  Head: Normocephalic and atraumatic.  Eyes: Conjunctivae and EOM are normal.  Pupils are equal, round, and reactive to light.  Neck: Normal range of motion.  Cardiovascular: Normal rate and regular rhythm.   Pulmonary/Chest: Effort normal and breath sounds normal.  Abdominal: Soft. Bowel sounds are normal.  Nursing note and vitals reviewed.  Motor strength is 5/5 bilateral deltoids, biceps, triceps and grip 5/5 right hip flexor and knee extensor and flexor plantar flexion 4/5 in the left hip flexor and extensor ankle dorsi flexion plantar flexor some pain inhibition Negative straight leg raising Sensation is reduced to pinprick in the left L4-L5 distribution normal on the right side Deep tendon reflexes are 1+ bilateral knees and ankles Has  normal hip internal/external rotation no pain with hip internal/external rotation on the left side      Assessment & Plan:  1. Chronic L4 radiculopathy but now with worsening of symptoms. He may be developing some increased L5 symptoms. He's had surgery in the past and has had evidence of epidural fibrosis on the last imaging study. I recommend that he follows up with neurosurgery to further evaluate. A repeat CT myelogram may be needed. Will make a referral back to Dr. Cyndy Freeze who did his original surgery Greater than 9 years ago  The patient's increased pain has resulted in regard to his functional status. He states that this has affected his activities of daily living. Will Change hydrocodone 7.5 mg 4 times a day to oxycodone 7.5 movement 4 times a day  Also we will increase gabapentin to 400 mg 3 times a day  2. Left groin pain this may be related to osteoarthritis or it could be an upper lumbar radiculopathy. We'll check hip x-rays, if significant osteoarthritis may benefit from intra-articular hip injection

## 2015-05-22 NOTE — Progress Notes (Signed)
Urine drug screen for this encounter is consistent for prescribed medication 

## 2015-06-04 ENCOUNTER — Telehealth: Payer: Self-pay | Admitting: *Deleted

## 2015-06-04 NOTE — Telephone Encounter (Signed)
[facility] called to question a prescription that was being filled for oxycodone 7.5/325 because he had been on hydrocodone.  I approved the fill because he was given the Rx on 05/22/15 by Dr Letta Pate due to increased pain not relieved by the hydrocodone.

## 2015-06-05 ENCOUNTER — Encounter: Payer: Medicare Other | Admitting: Registered Nurse

## 2015-06-23 ENCOUNTER — Encounter: Payer: Self-pay | Admitting: Physical Medicine & Rehabilitation

## 2015-06-23 ENCOUNTER — Encounter: Payer: Medicare Other | Attending: Physical Medicine & Rehabilitation

## 2015-06-23 ENCOUNTER — Ambulatory Visit (HOSPITAL_BASED_OUTPATIENT_CLINIC_OR_DEPARTMENT_OTHER): Payer: Medicare Other | Admitting: Physical Medicine & Rehabilitation

## 2015-06-23 VITALS — BP 122/68 | HR 69

## 2015-06-23 DIAGNOSIS — Z5181 Encounter for therapeutic drug level monitoring: Secondary | ICD-10-CM | POA: Insufficient documentation

## 2015-06-23 DIAGNOSIS — M545 Low back pain, unspecified: Secondary | ICD-10-CM

## 2015-06-23 DIAGNOSIS — M961 Postlaminectomy syndrome, not elsewhere classified: Secondary | ICD-10-CM | POA: Diagnosis not present

## 2015-06-23 DIAGNOSIS — G8929 Other chronic pain: Secondary | ICD-10-CM

## 2015-06-23 DIAGNOSIS — M5416 Radiculopathy, lumbar region: Secondary | ICD-10-CM

## 2015-06-23 DIAGNOSIS — G894 Chronic pain syndrome: Secondary | ICD-10-CM | POA: Insufficient documentation

## 2015-06-23 DIAGNOSIS — Z79899 Other long term (current) drug therapy: Secondary | ICD-10-CM | POA: Diagnosis not present

## 2015-06-23 MED ORDER — PREGABALIN 75 MG PO CAPS: 75.0000 mg | ORAL_CAPSULE | Freq: Two times a day (BID) | ORAL | Status: DC

## 2015-06-23 MED ORDER — OXYCODONE HCL 10 MG PO TABS: 10.0000 mg | ORAL_TABLET | Freq: Four times a day (QID) | ORAL | Status: DC

## 2015-06-23 NOTE — Patient Instructions (Signed)
He may start the Lyrica 75 mg twice a day right away  Finish up the 7.5 mg oxycodone before you start the 10 mg oxycodone.  Make sure you keep your appointment with Dr. Cyndy Freeze next week. You may need some further imaging studies.

## 2015-06-23 NOTE — Progress Notes (Signed)
Subjective:    Patient ID: Logan Briggs, male    DOB: [birth year], 59 y.o.   MRN: [id number]  HPI 59 year old male with a history of L4-L5 decompression in 2006by Dr. Cyndy Freeze.He had what looks to be a fusion in 2008 at L4-5  He has been very well controlled on hydrocodone 7.5 mg 3 times a day or 4 times a day over last several years however over the last several months has complained of increasing low back pain which radiates into the left hip and into the left leg. His pain medication was changed to oxycodone 7.5 mg 4 times per day and this helped a little bit at her than the hydrocodone. We also prescribed gabapentin for the leg pain. He was on 400 mg 3 times a day but felt like this did not help him at all and he did not feel any change when he stopped this medication several weeks ago. He has never tried Lyrica. His low back pain and his leg pain seemed to come on at the same time. His leg pain is actually the biggest complaint at the current time it goes down from the thigh toward the leg but not into the foot.  No bowel or bladder dysfunction, No fevers or weight loss  Pain Inventory Average Pain 7 Pain Right Now 7 My pain is intermittent, sharp and stabbing  In the last 24 hours, has pain interfered with the following? General activity 5 Relation with others 5 Enjoyment of life 6 What TIME of day is your pain at its worst? Evening and Night Sleep (in general) Good  Pain is worse with: some activites Pain improves with: heat/ice and medication Relief from Meds: 7  Mobility walk without assistance how many minutes can you walk? 60 minutes ability to climb steps?  yes do you drive?  yes transfers alone  Function employed # of hrs/week 40 Do you have any goals in this area?  no  Neuro/Psych No problems in this area  Prior Studies Any changes since last visit?  no  Physicians involved in your care Any changes since last visit?  no   History reviewed. No  pertinent family history. Social History   Social History  . Marital Status: Married    Spouse Name: N/A  . Number of Children: N/A  . Years of Education: N/A   Social History Main Topics  . Smoking status: Current Every Day Smoker -- 1.50 packs/day for 20 years    Types: Cigarettes  . Smokeless tobacco: Never Used     Comment: desires to quit but starts again when upset or stressed  . Alcohol Use: Yes     Comment: only on holidays, beer  . Drug Use: No  . Sexual Activity: Not Asked   Other Topics Concern  . None   Social History Narrative   History reviewed. No pertinent past surgical history. Past Medical History  Diagnosis Date  . Glaucoma    BP 122/68 mmHg  Pulse 69  SpO2 97%  Opioid Risk Score:   Fall Risk Score:  `1  Depression screen PHQ 2/9  Depression screen [facility] 2/9 06/23/2015 05/22/2015  Decreased Interest 0 0  Down, Depressed, Hopeless 0 0  PHQ - 2 Score 0 0  Altered sleeping - 3  Tired, decreased energy - 0  Change in appetite - 0  Feeling bad or failure about yourself  - 3  Trouble concentrating - 0  Moving slowly or fidgety/restless - 3  Suicidal  thoughts - 0  PHQ-9 Score - 9  [facility] to question a prescription that was being filled for oxycodone 7.5/325 because he had been on hydrocodone.  I approved the fill because he was given the Rx on 05/22/15 by Dr Letta Pate due to increased pain not relieved by the hydrocodone.   Review of Systems  All other systems reviewed and are negative.      Objective:   Physical Exam  Constitutional: He is oriented to person, place, and time. He appears well-developed and well-nourished.  HENT:  Head: Normocephalic and atraumatic.  Eyes: Conjunctivae and EOM are normal. Pupils are equal, round, and reactive to light.  Neck: Normal range of motion.  Musculoskeletal:       Left knee: Normal.       Left lower leg: He exhibits no tenderness, no swelling and no edema.  Neurological: He is alert and oriented  to person, place, and time.  Negative straight leg raising Motor strength is 5/5 bilateral hip flexor and extensor ankle dorsiflexor plantar flexor Sensation mildly reduced left lateral calf area.  Psychiatric: He has a normal mood and affect.  Nursing note and vitals reviewed.         Assessment & Plan:  1. Lumbar postlaminectomy syndrome L4-L5 level with prior fusion. He's done very well over the years but now has had increasing pain. We will change his oxycodone to 10 mg 4 times a day Trial of Lyrica 75 twice a day  Return to clinic in one month  2. Acute on chronic low back pain. I suspect he may have adjacent level degeneration either at L34 or L5-S1. I'm recommending neurosurgery reevaluation. He has an appointment in 1 week. He has no red flags. No reason for urgent consultation.

## 2015-07-21 ENCOUNTER — Encounter: Payer: Medicare Other | Attending: Physical Medicine & Rehabilitation

## 2015-07-21 ENCOUNTER — Encounter: Payer: Self-pay | Admitting: Physical Medicine & Rehabilitation

## 2015-07-21 ENCOUNTER — Ambulatory Visit (HOSPITAL_BASED_OUTPATIENT_CLINIC_OR_DEPARTMENT_OTHER): Payer: Medicare Other | Admitting: Physical Medicine & Rehabilitation

## 2015-07-21 VITALS — BP 136/69 | HR 78

## 2015-07-21 DIAGNOSIS — G894 Chronic pain syndrome: Secondary | ICD-10-CM | POA: Insufficient documentation

## 2015-07-21 DIAGNOSIS — M5416 Radiculopathy, lumbar region: Secondary | ICD-10-CM | POA: Diagnosis not present

## 2015-07-21 DIAGNOSIS — M961 Postlaminectomy syndrome, not elsewhere classified: Secondary | ICD-10-CM

## 2015-07-21 DIAGNOSIS — Z5181 Encounter for therapeutic drug level monitoring: Secondary | ICD-10-CM | POA: Diagnosis present

## 2015-07-21 DIAGNOSIS — Z79899 Other long term (current) drug therapy: Secondary | ICD-10-CM | POA: Insufficient documentation

## 2015-07-21 MED ORDER — OXYCODONE HCL 10 MG PO TABS: 10.0000 mg | ORAL_TABLET | Freq: Four times a day (QID) | ORAL | Status: DC

## 2015-07-21 NOTE — Progress Notes (Signed)
Subjective:    Patient ID: Logan Briggs, male    DOB: Mar 08, 1956, 59 y.o.   MRN: [id number]  HPI Chief complaint still having left lower extremity pain and numbness, my neurosurgeon said there was nothing new on the MRI. Oxycodone today expected around #46 but has #39. Discussed with patient that he needs to stick with the four-time per day schedule. Patient had some good relief with Lyrica samples given by Dr. Cyndy Freeze. We had previously written a prescription for this medication however when he took it to the pharmacy was $300.  Patient was seen by Dr. Cyndy Freeze, had a repeat MRI of the lumbar spine which according to Dr. Cyndy Freeze did not show anything new. We discussed nerve studies which the patient has not had thus far. Pain Inventory Average Pain 7 Pain Right Now 7 My pain is sharp and stabbing  In the last 24 hours, has pain interfered with the following? General activity 5 Relation with others 5 Enjoyment of life 6 What TIME of day is your pain at its worst? evening and night Sleep (in general) Good  Pain is worse with: some activites Pain improves with: heat/ice and medication Relief from Meds: 7  Mobility ability to climb steps?  yes do you drive?  yes  Function I need assistance with the following:  household duties  Neuro/Psych No problems in this area  Prior Studies Any changes since last visit?  no  Physicians involved in your care Any changes since last visit?  no   History reviewed. No pertinent family history. Social History   Social History  . Marital Status: Married    Spouse Name: N/A  . Number of Children: N/A  . Years of Education: N/A   Social History Main Topics  . Smoking status: Current Every Day Smoker -- 1.50 packs/day for 20 years    Types: Cigarettes  . Smokeless tobacco: Never Used     Comment: desires to quit but starts again when upset or stressed  . Alcohol Use: Yes     Comment: only on holidays, beer  . Drug Use: No  .  Sexual Activity: Not Asked   Other Topics Concern  . None   Social History Narrative   History reviewed. No pertinent past surgical history. Past Medical History  Diagnosis Date  . Glaucoma    BP 136/69 mmHg  Pulse 78  SpO2 97%  Opioid Risk Score:   Fall Risk Score:  `1  Depression screen PHQ 2/9  Depression screen [facility] 2/9 07/21/2015 06/23/2015 05/22/2015  Decreased Interest 0 0 0  Down, Depressed, Hopeless 0 0 0  PHQ - 2 Score 0 0 0  Altered sleeping - - 3  Tired, decreased energy - - 0  Change in appetite - - 0  Feeling bad or failure about yourself  - - 3  Trouble concentrating - - 0  Moving slowly or fidgety/restless - - 3  Suicidal thoughts - - 0  PHQ-9 Score - - 9     Review of Systems  All other systems reviewed and are negative.      Objective:   Physical Exam  Neurological:  Reflex Scores:      Patellar reflexes are 1+ on the right side and 1+ on the left side.      Achilles reflexes are 2+ on the right side and 2+ on the left side. Decreased sensation to vibration at the Left toe and ankle on the left intact at the knee L3  L4 L5  Nursing note and vitals reviewed.  His lumbar spine has good forward flexion but extension is limited as is lateral bending. Negative straight leg raising test He has no tenderness to palpation over his fibular head. No swelling in the left lower extremity.      Assessment & Plan:  1.  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 According to his neurosurgeon no new lesions seen. Is unclear whether the patient has perhaps some epidural scarring around his lumbar nerve roots or another lesion such as peroneal neuropathy. In either case I think the EMG can help resolve this. I discussed with patient he agrees with this plan.  We discussed his symptoms his medications and additional workup. Over half of the 25 min visit was spent counseling and coordinating care.  In the meantime we'll continue  oxycodone tomograms 4 times per day. We discussed dosage reduction but he does not think he can tolerate this from a pain standpoint at the current  time.

## 2015-07-21 NOTE — Patient Instructions (Signed)
Take a shower on the day that you're having the test. Do not use any lotion on your legs on the day of the test

## 2015-08-22 ENCOUNTER — Encounter: Payer: Self-pay | Admitting: Physical Medicine & Rehabilitation

## 2015-08-22 ENCOUNTER — Ambulatory Visit (HOSPITAL_BASED_OUTPATIENT_CLINIC_OR_DEPARTMENT_OTHER): Payer: Medicare Other | Admitting: Physical Medicine & Rehabilitation

## 2015-08-22 ENCOUNTER — Encounter: Payer: Medicare Other | Attending: Physical Medicine & Rehabilitation

## 2015-08-22 VITALS — BP 129/69 | HR 77

## 2015-08-22 DIAGNOSIS — Z79899 Other long term (current) drug therapy: Secondary | ICD-10-CM

## 2015-08-22 DIAGNOSIS — G894 Chronic pain syndrome: Secondary | ICD-10-CM | POA: Diagnosis not present

## 2015-08-22 DIAGNOSIS — Z5181 Encounter for therapeutic drug level monitoring: Secondary | ICD-10-CM | POA: Diagnosis not present

## 2015-08-22 DIAGNOSIS — M5416 Radiculopathy, lumbar region: Secondary | ICD-10-CM

## 2015-08-22 DIAGNOSIS — M5432 Sciatica, left side: Secondary | ICD-10-CM

## 2015-08-22 MED ORDER — TOPIRAMATE 25 MG PO TABS: 25.0000 mg | ORAL_TABLET | Freq: Two times a day (BID) | ORAL | Status: DC

## 2015-08-22 MED ORDER — PREGABALIN 75 MG PO CAPS: 75.0000 mg | ORAL_CAPSULE | Freq: Two times a day (BID) | ORAL | Status: DC

## 2015-08-22 MED ORDER — OXYCODONE HCL 10 MG PO TABS: 10.0000 mg | ORAL_TABLET | Freq: Four times a day (QID) | ORAL | Status: DC

## 2015-08-22 NOTE — Progress Notes (Signed)
  EMG performed today please see scanned report under media  There is no evidence of fibular neuropathy

## 2015-08-22 NOTE — Patient Instructions (Addendum)
Your nerve study did not show any new issues. I think your symptoms are mainly from scarring around the nerves from the previous surgery  We will try another nerve pain medicine called Topamax, we will start a low dose but may have to increase the dose

## 2015-09-11 NOTE — Progress Notes (Signed)
Urine drug screen for this encounter is consistent for prescribed medication 

## 2015-09-19 ENCOUNTER — Encounter: Payer: Medicare Other | Attending: Physical Medicine & Rehabilitation

## 2015-09-19 ENCOUNTER — Encounter: Payer: Self-pay | Admitting: Physical Medicine & Rehabilitation

## 2015-09-19 ENCOUNTER — Ambulatory Visit (HOSPITAL_BASED_OUTPATIENT_CLINIC_OR_DEPARTMENT_OTHER): Payer: Medicare Other | Admitting: Physical Medicine & Rehabilitation

## 2015-09-19 VITALS — BP 140/75 | HR 70 | Resp 14

## 2015-09-19 DIAGNOSIS — M5416 Radiculopathy, lumbar region: Secondary | ICD-10-CM | POA: Diagnosis not present

## 2015-09-19 DIAGNOSIS — G894 Chronic pain syndrome: Secondary | ICD-10-CM | POA: Diagnosis present

## 2015-09-19 DIAGNOSIS — M961 Postlaminectomy syndrome, not elsewhere classified: Secondary | ICD-10-CM | POA: Diagnosis not present

## 2015-09-19 DIAGNOSIS — Z79899 Other long term (current) drug therapy: Secondary | ICD-10-CM | POA: Diagnosis not present

## 2015-09-19 DIAGNOSIS — Z5181 Encounter for therapeutic drug level monitoring: Secondary | ICD-10-CM | POA: Diagnosis present

## 2015-09-19 MED ORDER — OXYCODONE-ACETAMINOPHEN 7.5-325 MG PO TABS: 1.0000 | ORAL_TABLET | Freq: Four times a day (QID) | ORAL | Status: DC | PRN

## 2015-09-19 MED ORDER — PREGABALIN 75 MG PO CAPS: 75.0000 mg | ORAL_CAPSULE | Freq: Two times a day (BID) | ORAL | Status: DC

## 2015-09-19 NOTE — Patient Instructions (Signed)
We will keep you on oxycodone however lower the dose to 7.5 rather than 10 mg 4 times per day Continue on the Lyrica 75 mg twice a day

## 2015-09-19 NOTE — Progress Notes (Signed)
Subjective:    Patient ID: Logan Briggs, male    DOB: 1956-11-02, 59 y.o.   MRN: [id number]  HPI Low back and left leg pain doing better. Pain score still around 7 but feeling better. Still has some numbness in the left lower ball the way to the big toe. Pain is improving however. No weakness. Patient states she walks about 1/2 mile per day.  Patient is now able to do dishes as well as other light housework. When his pain was flared up he was unable to do these things. Due to the increased pain his pain medication was increased from Pain Inventory Average Pain 7 Pain Right Now 7 My pain is sharp and stabbing  In the last 24 hours, has pain interfered with the following? General activity 5 Relation with others 5 Enjoyment of life 6 What TIME of day is your pain at its worst? evening, night Sleep (in general) Good  Pain is worse with: na Pain improves with: heat/ice and medication Relief from Meds: 7  Mobility walk without assistance ability to climb steps?  yes do you drive?  yes Do you have any goals in this area?  no  Function I need assistance with the following:  household duties Do you have any goals in this area?  no  Neuro/Psych No problems in this area  Prior Studies Any changes since last visit?  no  Physicians involved in your care Any changes since last visit?  no   History reviewed. No pertinent family history. Social History   Social History  . Marital Status: Married    Spouse Name: N/A  . Number of Children: N/A  . Years of Education: N/A   Social History Main Topics  . Smoking status: Current Every Day Smoker -- 1.50 packs/day for 20 years    Types: Cigarettes  . Smokeless tobacco: Never Used     Comment: desires to quit but starts again when upset or stressed  . Alcohol Use: Yes     Comment: only on holidays, beer  . Drug Use: No  . Sexual Activity: Not Asked   Other Topics Concern  . None   Social History Narrative    History reviewed. No pertinent past surgical history. Past Medical History  Diagnosis Date  . Glaucoma    BP 140/75 mmHg  Pulse 70  Resp 14  SpO2 97%  Opioid Risk Score:   Fall Risk Score:  `1  Depression screen PHQ 2/9  Depression screen [facility] 2/9 07/21/2015 06/23/2015 05/22/2015  Decreased Interest 0 0 0  Down, Depressed, Hopeless 0 0 0  PHQ - 2 Score 0 0 0  Altered sleeping - - 3  Tired, decreased energy - - 0  Change in appetite - - 0  Feeling bad or failure about yourself  - - 3  Trouble concentrating - - 0  Moving slowly or fidgety/restless - - 3  Suicidal thoughts - - 0  PHQ-9 Score - - 9     Review of Systems  All other systems reviewed and are negative.      Objective:   Physical Exam  Constitutional: He is oriented to person, place, and time.  Musculoskeletal:       Lumbar back: He exhibits decreased range of motion. He exhibits no tenderness.  - SLR  Neurological: He is alert and oriented to person, place, and time.  Motor strength is 5/5 bilateral hip flexor and knee extensor ankle dorsiflexor and plantar flexor  Psychiatric:  He has a normal mood and affect.  Nursing note and vitals reviewed.         Assessment & Plan:  1. 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 Unclear what caused his exacerbation of pain. He feels like he was becoming tolerant to hydrocodone 7.5 mg. He was switched to oxycodone 7.5 and then increase to oxycodone 10 mg as well as switching from gabapentin to Lyrica. He is doing much better. 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 Will reduce oxycodone 7.5 4 times a day continue Lyrica 75 mg per day. Return in 1 month if pain recurs would go back up to 10 mg

## 2015-10-10 ENCOUNTER — Telehealth: Payer: Self-pay | Admitting: *Deleted
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
## 2015-10-14 ENCOUNTER — Telehealth: Payer: Self-pay | Admitting: *Deleted

## 2015-10-14 NOTE — Telephone Encounter (Signed)
Patient called back to report that he is not getting sufficient relief with decrease in medication. See thread below, on his previous call

## 2015-10-14 NOTE — Telephone Encounter (Signed)
Error

## 2015-10-24 ENCOUNTER — Encounter: Payer: Self-pay | Admitting: Registered Nurse

## 2015-10-24 ENCOUNTER — Encounter: Payer: Medicare Other | Attending: Physical Medicine & Rehabilitation | Admitting: Registered Nurse

## 2015-10-24 VITALS — BP 160/69 | HR 86 | Resp 14

## 2015-10-24 DIAGNOSIS — Z79899 Other long term (current) drug therapy: Secondary | ICD-10-CM | POA: Insufficient documentation

## 2015-10-24 DIAGNOSIS — Z5181 Encounter for therapeutic drug level monitoring: Secondary | ICD-10-CM | POA: Diagnosis present

## 2015-10-24 DIAGNOSIS — M5416 Radiculopathy, lumbar region: Secondary | ICD-10-CM | POA: Diagnosis not present

## 2015-10-24 DIAGNOSIS — G4700 Insomnia, unspecified: Secondary | ICD-10-CM | POA: Diagnosis not present

## 2015-10-24 DIAGNOSIS — G894 Chronic pain syndrome: Secondary | ICD-10-CM | POA: Diagnosis not present

## 2015-10-24 DIAGNOSIS — M961 Postlaminectomy syndrome, not elsewhere classified: Secondary | ICD-10-CM | POA: Diagnosis not present

## 2015-10-24 MED ORDER — OXYCODONE HCL 10 MG PO TABS: 10.0000 mg | ORAL_TABLET | Freq: Four times a day (QID) | ORAL | Status: DC | PRN

## 2015-10-24 MED ORDER — NORTRIPTYLINE HCL 10 MG PO CAPS: 10.0000 mg | ORAL_CAPSULE | Freq: Every day | ORAL | Status: DC

## 2015-10-24 NOTE — Progress Notes (Addendum)
Subjective:    Patient ID: Logan Briggs, male    DOB: [birth year], 59 y.o.   MRN: [id number]  HPI: [name] is a 59 year old male who returns for follow up for chronic pain and medication refill. He says his pain is located in his lower back radiating into his left lower extremity laterally. Also states his pain has intensified in his lower back with the decrease in oxycodone, reviewed Dr. Letta Pate note and discussed with Dr. Letta Pate we will resume Oxycodone 10 mg. He verbalizes understanding.He rates his pain 7. His current exercise regime is walking. [name] wants to wait till 11/09/2015 to resume oxycodone his insurance will be effective.  Pain Inventory Average Pain 7 Pain Right Now 7 My pain is sharp and stabbing  In the last 24 hours, has pain interfered with the following? General activity 5 Relation with others 5 Enjoyment of life 6 What TIME of day is your pain at its worst? evening, night Sleep (in general) Good  Pain is worse with: some activites Pain improves with: heat/ice and medication Relief from Meds: 7  Mobility ability to climb steps?  yes do you drive?  yes Do you have any goals in this area?  no  Function I need assistance with the following:  household duties Do you have any goals in this area?  no  Neuro/Psych No problems in this area  Prior Studies Any changes since last visit?  no bone scan x-rays CT/MRI nerve study  Physicians involved in your care Any changes since last visit?  no   History reviewed. No pertinent family history. Social History   Social History  . Marital Status: Married    Spouse Name: N/A  . Number of Children: N/A  . Years of Education: N/A   Social History Main Topics  . Smoking status: Current Every Day Smoker -- 1.50 packs/day for 20 years    Types: Cigarettes  . Smokeless tobacco: Never Used     Comment: desires to quit but starts again when upset or stressed  . Alcohol Use: Yes   Comment: only on holidays, beer  . Drug Use: No  . Sexual Activity: Not Asked   Other Topics Concern  . None   Social History Narrative   History reviewed. No pertinent past surgical history. Past Medical History  Diagnosis Date  . Glaucoma    BP 160/69 mmHg  Pulse 86  Resp 14  SpO2 96%  Opioid Risk Score:   Fall Risk Score:  `1  Depression screen PHQ 2/9  Depression screen [facility] 2/9 07/21/2015 06/23/2015 05/22/2015  Decreased Interest 0 0 0  Down, Depressed, Hopeless 0 0 0  PHQ - 2 Score 0 0 0  Altered sleeping - - 3  Tired, decreased energy - - 0  Change in appetite - - 0  Feeling bad or failure about yourself  - - 3  Trouble concentrating - - 0  Moving slowly or fidgety/restless - - 3  Suicidal thoughts - - 0  PHQ-9 Score - - 9     Review of Systems  All other systems reviewed and are negative.      Objective:   Physical Exam  Constitutional: He is oriented to person, place, and time. He appears well-developed and well-nourished.  HENT:  Head: Normocephalic and atraumatic.  Neck: Normal range of motion. Neck supple.  Cardiovascular: Normal rate and regular rhythm.   Pulmonary/Chest: Effort normal and breath sounds normal.  Musculoskeletal:  Normal  Muscle Bulk and Muscle Testing Reveals: Upper Extremities: Full ROM and Muscle Strength 5/5 Back without spinal or paraspinal tenderness Lower Extremities: Full ROM and Muscle Strength 5/5 Arises from chair slowly Antalgic  Gait  Neurological: He is alert and oriented to person, place, and time.  Skin: Skin is warm and dry.  Psychiatric: He has a normal mood and affect.  Nursing note and vitals reviewed.         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  RX: Oxycodone 10 mg one tablet 4 times a day  as needed for pain #120  Continue with exercise regime. 2. Lumbar Radiculopathy: Continue Lyrica 3. Muscle Spasm: Continue Baclofen 4. Insomnia: RX: Pamelor  20 minutes  of face to face patient care time was spent during this visit. All questions were encouraged and answered.   F/U in 1 month

## 2015-11-28 ENCOUNTER — Encounter: Payer: Self-pay | Admitting: Registered Nurse

## 2015-11-28 ENCOUNTER — Encounter: Payer: Medicare Other | Attending: Physical Medicine & Rehabilitation | Admitting: Registered Nurse

## 2015-11-28 VITALS — BP 148/74 | HR 73 | Resp 14

## 2015-11-28 DIAGNOSIS — G894 Chronic pain syndrome: Secondary | ICD-10-CM | POA: Diagnosis not present

## 2015-11-28 DIAGNOSIS — M961 Postlaminectomy syndrome, not elsewhere classified: Secondary | ICD-10-CM

## 2015-11-28 DIAGNOSIS — M5416 Radiculopathy, lumbar region: Secondary | ICD-10-CM | POA: Diagnosis not present

## 2015-11-28 DIAGNOSIS — Z79899 Other long term (current) drug therapy: Secondary | ICD-10-CM

## 2015-11-28 DIAGNOSIS — Z5181 Encounter for therapeutic drug level monitoring: Secondary | ICD-10-CM

## 2015-11-28 DIAGNOSIS — M62838 Other muscle spasm: Secondary | ICD-10-CM

## 2015-11-28 MED ORDER — OXYCODONE HCL 10 MG PO TABS: 10.0000 mg | ORAL_TABLET | Freq: Four times a day (QID) | ORAL | Status: DC | PRN
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
## 2016-01-02 ENCOUNTER — Encounter: Payer: Self-pay | Admitting: Registered Nurse

## 2016-01-02 ENCOUNTER — Encounter: Payer: Medicare Other | Attending: Physical Medicine & Rehabilitation | Admitting: Registered Nurse

## 2016-01-02 VITALS — BP 131/67 | HR 88 | Resp 14

## 2016-01-02 DIAGNOSIS — Z5181 Encounter for therapeutic drug level monitoring: Secondary | ICD-10-CM | POA: Insufficient documentation

## 2016-01-02 DIAGNOSIS — Z79899 Other long term (current) drug therapy: Secondary | ICD-10-CM | POA: Diagnosis not present

## 2016-01-02 DIAGNOSIS — G4700 Insomnia, unspecified: Secondary | ICD-10-CM

## 2016-01-02 DIAGNOSIS — M5416 Radiculopathy, lumbar region: Secondary | ICD-10-CM

## 2016-01-02 DIAGNOSIS — M961 Postlaminectomy syndrome, not elsewhere classified: Secondary | ICD-10-CM

## 2016-01-02 DIAGNOSIS — G894 Chronic pain syndrome: Secondary | ICD-10-CM | POA: Insufficient documentation

## 2016-01-02 DIAGNOSIS — M62838 Other muscle spasm: Secondary | ICD-10-CM

## 2016-01-02 MED ORDER — NORTRIPTYLINE HCL 10 MG PO CAPS: 10.0000 mg | ORAL_CAPSULE | Freq: Every day | ORAL | Status: DC

## 2016-01-02 MED ORDER — OXYCODONE HCL 10 MG PO TABS: 10.0000 mg | ORAL_TABLET | Freq: Four times a day (QID) | ORAL | Status: DC | PRN

## 2016-01-02 NOTE — Progress Notes (Signed)
Subjective:    Patient ID: Logan Briggs, male    DOB: 06-19-1956, 60 y.o.   MRN: [id number]  HPI: [name] is a 60 year old male who returns for follow up for chronic pain and medication refill. He states his pain is located in his lower back.He rates his pain 8. His current exercise regime is walking and performing stretching exercises.  Pain Inventory Average Pain 8 Pain Right Now 8 My pain is sharp and stabbing  In the last 24 hours, has pain interfered with the following? General activity 6 Relation with others 6 Enjoyment of life 7 What TIME of day is your pain at its worst? evening, night Sleep (in general) NA  Pain is worse with: some activites Pain improves with: heat/ice Relief from Meds: 7  Mobility how many minutes can you walk? 60 min ability to climb steps?  yes do you drive?  yes Do you have any goals in this area?  no  Function Do you have any goals in this area?  no  Neuro/Psych No problems in this area  Prior Studies Any changes since last visit?  no bone scan x-rays CT/MRI nerve study  Physicians involved in your care Any changes since last visit?  no   History reviewed. No pertinent family history. Social History   Social History  . Marital Status: Married    Spouse Name: N/A  . Number of Children: N/A  . Years of Education: N/A   Social History Main Topics  . Smoking status: Current Every Day Smoker -- 1.50 packs/day for 20 years    Types: Cigarettes  . Smokeless tobacco: Never Used     Comment: desires to quit but starts again when upset or stressed  . Alcohol Use: Yes     Comment: only on holidays, beer  . Drug Use: No  . Sexual Activity: Not Asked   Other Topics Concern  . None   Social History Narrative   History reviewed. No pertinent past surgical history. Past Medical History  Diagnosis Date  . Glaucoma    BP 131/67 mmHg  Pulse 88  Resp 14  Opioid Risk Score:   Fall Risk Score:  `1  Depression  screen PHQ 2/9  Depression screen [facility] 2/9 07/21/2015 06/23/2015 05/22/2015  Decreased Interest 0 0 0  Down, Depressed, Hopeless 0 0 0  PHQ - 2 Score 0 0 0  Altered sleeping - - 3  Tired, decreased energy - - 0  Change in appetite - - 0  Feeling bad or failure about yourself  - - 3  Trouble concentrating - - 0  Moving slowly or fidgety/restless - - 3  Suicidal thoughts - - 0  PHQ-9 Score - - 9     Review of Systems  All other systems reviewed and are negative.      Objective:   Physical Exam  Constitutional: He is oriented to person, place, and time. He appears well-developed and well-nourished.  HENT:  Head: Normocephalic and atraumatic.  Neck: Normal range of motion. Neck supple.  Cardiovascular: Normal rate and regular rhythm.   Pulmonary/Chest: Effort normal and breath sounds normal.  Musculoskeletal:  Normal Muscle Bulk and Muscle Testing Reveals: Upper Extremities: Full ROM and Muscle Strength 5/5 Back without spinal or paraspinal tenderness Lower Extremities: Full ROM and Muscle Strength 5/5 Arises from chair with ease Narrow Based Gait  Neurological: He is alert and oriented to person, place, and time.  Skin: Skin is warm and  dry.  Psychiatric: He has a normal mood and affect.  Nursing note and vitals reviewed.         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  RX: Oxycodone 10 mg one tablet 4 times a day as needed for pain #120  Continue with exercise regime. 2. Lumbar Radiculopathy: Continue Lyrica and Topamax 3. Muscle Spasm: Continue Baclofen 4. Insomnia: Continue: Pamelor  20 minutes of face to face patient care time was spent during this visit. All questions were encouraged and answered.   F/U in 1 month

## 2016-01-08 LAB — TOXASSURE SELECT,+ANTIDEPR,UR: PDF: 0

## 2016-01-09 NOTE — Progress Notes (Signed)
Urine drug screen for this encounter is consistent for prescribed medication 

## 2016-01-12 ENCOUNTER — Other Ambulatory Visit: Payer: Self-pay

## 2016-01-12 MED ORDER — NORTRIPTYLINE HCL 10 MG PO CAPS: 10.0000 mg | ORAL_CAPSULE | Freq: Every day | ORAL | Status: DC

## 2016-01-12 NOTE — Telephone Encounter (Signed)
[facility] is requesting a 90 day supply. Pamelor sent for 90 day supply.

## 2016-01-30 ENCOUNTER — Encounter: Payer: Medicare Other | Attending: Physical Medicine & Rehabilitation | Admitting: Registered Nurse

## 2016-01-30 ENCOUNTER — Encounter: Payer: Self-pay | Admitting: Registered Nurse

## 2016-01-30 VITALS — BP 148/71 | HR 91

## 2016-01-30 DIAGNOSIS — G894 Chronic pain syndrome: Secondary | ICD-10-CM

## 2016-01-30 DIAGNOSIS — M961 Postlaminectomy syndrome, not elsewhere classified: Secondary | ICD-10-CM | POA: Diagnosis not present

## 2016-01-30 DIAGNOSIS — M5416 Radiculopathy, lumbar region: Secondary | ICD-10-CM | POA: Diagnosis not present

## 2016-01-30 DIAGNOSIS — Z5181 Encounter for therapeutic drug level monitoring: Secondary | ICD-10-CM | POA: Diagnosis present

## 2016-01-30 DIAGNOSIS — M62838 Other muscle spasm: Secondary | ICD-10-CM

## 2016-01-30 DIAGNOSIS — Z79899 Other long term (current) drug therapy: Secondary | ICD-10-CM | POA: Diagnosis not present

## 2016-01-30 MED ORDER — OXYCODONE HCL 10 MG PO TABS: 10.0000 mg | ORAL_TABLET | Freq: Four times a day (QID) | ORAL | Status: DC | PRN

## 2016-01-30 NOTE — Progress Notes (Signed)
Subjective:    Patient ID: Logan Briggs, male    DOB: 02-27-1956, 60 y.o.   MRN: [id number]  HPI: [name] is a 60 year old male who returns for follow up for chronic pain and medication refill. He states his pain is located in his lower back.He rates his pain 8. His current exercise regime is walking, yardwork and performing stretching exercises.  Pain Inventory Average Pain 8 Pain Right Now 8 My pain is sharp and stabbing  In the last 24 hours, has pain interfered with the following? General activity 6 Relation with others 6 Enjoyment of life 7 What TIME of day is your pain at its worst? night Sleep (in general) Good  Pain is worse with: some activites Pain improves with: heat/ice and medication Relief from Meds: 7  Mobility ability to climb steps?  yes do you drive?  yes  Function Do you have any goals in this area?  no  Neuro/Psych No problems in this area  Prior Studies Any changes since last visit?  no  Physicians involved in your care Any changes since last visit?  no   History reviewed. No pertinent family history. Social History   Social History  . Marital Status: Married    Spouse Name: N/A  . Number of Children: N/A  . Years of Education: N/A   Social History Main Topics  . Smoking status: Current Every Day Smoker -- 1.50 packs/day for 20 years    Types: Cigarettes  . Smokeless tobacco: Never Used     Comment: desires to quit but starts again when upset or stressed  . Alcohol Use: Yes     Comment: only on holidays, beer  . Drug Use: No  . Sexual Activity: Not Asked   Other Topics Concern  . None   Social History Narrative   History reviewed. No pertinent past surgical history. Past Medical History  Diagnosis Date  . Glaucoma    BP 148/71 mmHg  Pulse 91  SpO2 94%  Opioid Risk Score:   Fall Risk Score:  `1  Depression screen PHQ 2/9  Depression screen [facility] 2/9 07/21/2015 06/23/2015 05/22/2015  Decreased Interest 0 0 0    Down, Depressed, Hopeless 0 0 0  PHQ - 2 Score 0 0 0  Altered sleeping - - 3  Tired, decreased energy - - 0  Change in appetite - - 0  Feeling bad or failure about yourself  - - 3  Trouble concentrating - - 0  Moving slowly or fidgety/restless - - 3  Suicidal thoughts - - 0  PHQ-9 Score - - 9     Review of Systems  All other systems reviewed and are negative.      Objective:   Physical Exam  Constitutional: He is oriented to person, place, and time. He appears well-developed and well-nourished.  HENT:  Head: Normocephalic and atraumatic.  Neck: Normal range of motion. Neck supple.  Cardiovascular: Normal rate and regular rhythm.   Pulmonary/Chest: Effort normal and breath sounds normal.  Musculoskeletal:  Normal Muscle Bulk and Muscle Testing Reveals: Upper Extremities: Full ROM and Muscle Strength 5/5 Back without spinal tenderness Lower Extremities: Full ROM and Muscle Strength 5/5 Arises from chair with ease Narrow Based Gait  Neurological: He is alert and oriented to person, place, and time.  Skin: Skin is warm and dry.  Psychiatric: He has a normal mood and affect.  Nursing note and vitals reviewed.  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  RX: Oxycodone 10 mg one tablet 4 times a day as needed for pain #120  Continue with exercise regime. 2. Lumbar Radiculopathy: Continue Lyrica and Topamax 3. Muscle Spasm: Continue Baclofen 4. Insomnia: Continue: Pamelor  20 minutes of face to face patient care time was spent during this visit. All questions were encouraged and answered.   F/U in 1 month

## 2016-02-11 ENCOUNTER — Encounter: Payer: Self-pay | Admitting: Physical Medicine & Rehabilitation

## 2016-03-05 ENCOUNTER — Encounter: Payer: Self-pay | Admitting: Registered Nurse

## 2016-03-05 ENCOUNTER — Encounter: Payer: Medicare Other | Attending: Physical Medicine & Rehabilitation | Admitting: Registered Nurse

## 2016-03-05 VITALS — BP 134/69 | HR 77 | Resp 14

## 2016-03-05 DIAGNOSIS — M62838 Other muscle spasm: Secondary | ICD-10-CM

## 2016-03-05 DIAGNOSIS — M961 Postlaminectomy syndrome, not elsewhere classified: Secondary | ICD-10-CM | POA: Diagnosis not present

## 2016-03-05 DIAGNOSIS — Z79899 Other long term (current) drug therapy: Secondary | ICD-10-CM

## 2016-03-05 DIAGNOSIS — M5416 Radiculopathy, lumbar region: Secondary | ICD-10-CM

## 2016-03-05 DIAGNOSIS — Z5181 Encounter for therapeutic drug level monitoring: Secondary | ICD-10-CM | POA: Diagnosis present

## 2016-03-05 DIAGNOSIS — G894 Chronic pain syndrome: Secondary | ICD-10-CM

## 2016-03-05 MED ORDER — OXYCODONE HCL 10 MG PO TABS: 10.0000 mg | ORAL_TABLET | Freq: Four times a day (QID) | ORAL | Status: DC | PRN

## 2016-03-05 NOTE — Progress Notes (Signed)
Subjective:    Patient ID: [name], male    DOB: [birth year], 60 y.o.   MRN: [id number]  HPI: [name] is a 60 year old male who returns for follow up for chronic pain and medication refill. He states his pain is located in his lower back radiating into his left lower extremity posteriorly.Marland KitchenHe rates his pain 7. His current exercise regime is walking, yardwork and performing stretching exercises.  Pain Inventory Average Pain 7 Pain Right Now 7 My pain is sharp and stabbing  In the last 24 hours, has pain interfered with the following? General activity 5 Relation with others 5 Enjoyment of life 6 What TIME of day is your pain at its worst? evening, night Sleep (in general) NA  Pain is worse with: NA Pain improves with: NA Relief from Meds: 7  Mobility ability to climb steps?  yes do you drive?  yes Do you have any goals in this area?  no  Function Do you have any goals in this area?  no  Neuro/Psych No problems in this area  Prior Studies Any changes since last visit?  no x-rays CT/MRI  Physicians involved in your care Any changes since last visit?  no   History reviewed. No pertinent family history. Social History   Social History  . Marital Status: Married    Spouse Name: N/A  . Number of Children: N/A  . Years of Education: N/A   Social History Main Topics  . Smoking status: Current Every Day Smoker -- 1.50 packs/day for 20 years    Types: Cigarettes  . Smokeless tobacco: Never Used     Comment: desires to quit but starts again when upset or stressed  . Alcohol Use: Yes     Comment: only on holidays, beer  . Drug Use: No  . Sexual Activity: Not Asked   Other Topics Concern  . None   Social History Narrative   History reviewed. No pertinent past surgical history. Past Medical History  Diagnosis Date  . Glaucoma    BP 134/69 mmHg  Pulse 77  Resp 14  SpO2 99%  Opioid Risk Score:   Fall Risk Score:  `1  Depression screen  PHQ 2/9  Depression screen [facility] 2/9 07/21/2015 06/23/2015 05/22/2015  Decreased Interest 0 0 0  Down, Depressed, Hopeless 0 0 0  PHQ - 2 Score 0 0 0  Altered sleeping - - 3  Tired, decreased energy - - 0  Change in appetite - - 0  Feeling bad or failure about yourself  - - 3  Trouble concentrating - - 0  Moving slowly or fidgety/restless - - 3  Suicidal thoughts - - 0  PHQ-9 Score - - 9     Review of Systems  All other systems reviewed and are negative.      Objective:   Physical Exam  Constitutional: He is oriented to person, place, and time. He appears well-developed and well-nourished.  HENT:  Head: Normocephalic and atraumatic.  Neck: Normal range of motion. Neck supple.  Cardiovascular: Normal rate and regular rhythm.   Pulmonary/Chest: Effort normal and breath sounds normal.  Musculoskeletal:  Normal Muscle Bulk and Muscle Testing Reveals: Upper Extremities: Full ROM and Muscle Strength 5/5 Back without spinal tenderness Lower Extremities: Full ROM and Muscle Strength 5/5 Arises from chair with ease Narrow Based Gait   Neurological: He is alert and oriented to person, place, and time.  Skin: Skin is warm and dry.  Psychiatric:  He has a normal mood and affect.  Nursing note and vitals reviewed.         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  Refilled: Oxycodone 10 mg one tablet 4 times a day as needed for pain #120. We will continue the opioid monitoring program, tis consists of regular clinic visits, examinations, urine drug screen, pill counts as well as use of [place] Controlled Substance Reporting System.  Continue with exercise regime. 2. Lumbar Radiculopathy: Continue Lyrica and Topamax 3. Muscle Spasm: Continue Baclofen 4. Insomnia: Continue: Pamelor  20 minutes of face to face patient care time was spent during this visit. All questions were encouraged and answered.   F/U in 1 month

## 2016-04-02 ENCOUNTER — Ambulatory Visit (HOSPITAL_BASED_OUTPATIENT_CLINIC_OR_DEPARTMENT_OTHER): Payer: Medicare Other | Admitting: Physical Medicine & Rehabilitation

## 2016-04-02 ENCOUNTER — Encounter: Payer: Medicare Other | Attending: Physical Medicine & Rehabilitation

## 2016-04-02 ENCOUNTER — Encounter: Payer: Self-pay | Admitting: Physical Medicine & Rehabilitation

## 2016-04-02 VITALS — BP 155/82 | HR 68 | Resp 14

## 2016-04-02 DIAGNOSIS — Z5181 Encounter for therapeutic drug level monitoring: Secondary | ICD-10-CM | POA: Insufficient documentation

## 2016-04-02 DIAGNOSIS — Z79899 Other long term (current) drug therapy: Secondary | ICD-10-CM | POA: Insufficient documentation

## 2016-04-02 DIAGNOSIS — G894 Chronic pain syndrome: Secondary | ICD-10-CM | POA: Insufficient documentation

## 2016-04-02 DIAGNOSIS — M5416 Radiculopathy, lumbar region: Secondary | ICD-10-CM | POA: Diagnosis not present

## 2016-04-02 DIAGNOSIS — M961 Postlaminectomy syndrome, not elsewhere classified: Secondary | ICD-10-CM | POA: Diagnosis not present

## 2016-04-02 MED ORDER — PREGABALIN 75 MG PO CAPS: 75.0000 mg | ORAL_CAPSULE | Freq: Two times a day (BID) | ORAL | Status: DC

## 2016-04-02 MED ORDER — OXYCODONE HCL 10 MG PO TABS: 10.0000 mg | ORAL_TABLET | Freq: Four times a day (QID) | ORAL | Status: DC | PRN

## 2016-04-02 NOTE — Patient Instructions (Signed)
Continue walking 1 hour per day

## 2016-04-02 NOTE — Progress Notes (Signed)
Subjective:    Patient ID: Logan Briggs, male    DOB: [birth year], 60 y.o.   MRN: [id number] 60 year old male with a history of L4-L5 decompression in 2006 by Dr. Cyndy Freeze.He had what looks to be a fusion in 2008 at L4-5 HPI Patient with chronic left lower extremity pain diagnosed with chronic L4 radiculitis. His EMG performed in the fall 2016 did not demonstrate any evidence of fibular neuropathy. He has tried gabapentin up to 400 mg dose in the past without much relief. He has tried tramadol without much relief. He was trialed on low-dose oxycodone i.e. 5 mg and 7.5 mg with only partial relief. He is now taking oxycodone 10 mg which she states is some good relief on a every 6 hours dosing cycle. No side effects from his narcotic analgesics  He is independent with all his self-care and mobility. He ambulates without an assistive device Pain Inventory Average Pain 6 Pain Right Now 6 My pain is sharp and stabbing  In the last 24 hours, has pain interfered with the following? General activity 6 Relation with others 6 Enjoyment of life 7 What TIME of day is your pain at its worst? evening, night Sleep (in general) NA  Pain is worse with: some activites Pain improves with: heat/ice Relief from Meds: 7  Mobility ability to climb steps?  yes do you drive?  yes  Function Do you have any goals in this area?  no  Neuro/Psych No problems in this area  Prior Studies Any changes since last visit?  no bone scan x-rays CT/MRI  Physicians involved in your care Any changes since last visit?  no   History reviewed. No pertinent family history. Social History   Social History  . Marital Status: Married    Spouse Name: N/A  . Number of Children: N/A  . Years of Education: N/A   Social History Main Topics  . Smoking status: Current Every Day Smoker -- 1.50 packs/day for 20 years    Types: Cigarettes  . Smokeless tobacco: Never Used     Comment: desires to quit but starts  again when upset or stressed  . Alcohol Use: Yes     Comment: only on holidays, beer  . Drug Use: No  . Sexual Activity: Not Asked   Other Topics Concern  . None   Social History Narrative   History reviewed. No pertinent past surgical history. Past Medical History  Diagnosis Date  . Glaucoma    BP 155/82 mmHg  Pulse 68  Resp 14  SpO2 97%  Opioid Risk Score:   Fall Risk Score:  `1  Depression screen PHQ 2/9  Depression screen [facility] 2/9 07/21/2015 06/23/2015 05/22/2015  Decreased Interest 0 0 0  Down, Depressed, Hopeless 0 0 0  PHQ - 2 Score 0 0 0  Altered sleeping - - 3  Tired, decreased energy - - 0  Change in appetite - - 0  Feeling bad or failure about yourself  - - 3  Trouble concentrating - - 0  Moving slowly or fidgety/restless - - 3  Suicidal thoughts - - 0  PHQ-9 Score - - 9     Review of Systems  All other systems reviewed and are negative.      Objective:   Physical Exam  Constitutional: He is oriented to person, place, and time. He appears well-developed and well-nourished.  HENT:  Head: Normocephalic and atraumatic.  Eyes: Conjunctivae and EOM are normal. Pupils are equal, round, and  reactive to light.  Neck: Normal range of motion.  Musculoskeletal:       Lumbar back: He exhibits decreased range of motion. He exhibits no tenderness, no deformity and no spasm.  Negative straight leg raising test  Lumbar spine reduced range of motion 50% flexion extension lateral bending and rotation  Neurological: He is alert and oriented to person, place, and time.  Psychiatric: He has a normal mood and affect.  Nursing note and vitals reviewed.   Motor strength is 5/5 in bilateral deltoid, biceps, triceps, grip 5/5 in the right hip flexor knee extensor ankle dorsiflexion 5/5 and left hip flexor and knee extensor and 4/5 and left ankle dorsiflexor Deep tendon reflexes are 2+ right knee 1+ left knee 1+ bilateral ankle      Assessment & Plan:  1. 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 He has very good relief with oxycodone 10 mg 4 times a day He takes his Lyrica 75 mg daily at bedtime when necessary when pain interferes with sleep. Last UDS 01/02/2016 was consistent  Continue opioid monitoring program. This consists of regular clinic visits, examinations, urine drug screen, pill counts as well as use of [place] controlled substance reporting System.  Nurse practitioner visit one month M.D. Visit in 6 months

## 2016-04-30 ENCOUNTER — Encounter: Payer: Self-pay | Admitting: Registered Nurse

## 2016-04-30 ENCOUNTER — Encounter: Payer: Medicare Other | Attending: Physical Medicine & Rehabilitation | Admitting: Registered Nurse

## 2016-04-30 VITALS — BP 143/85 | HR 65

## 2016-04-30 DIAGNOSIS — M961 Postlaminectomy syndrome, not elsewhere classified: Secondary | ICD-10-CM | POA: Diagnosis not present

## 2016-04-30 DIAGNOSIS — M5416 Radiculopathy, lumbar region: Secondary | ICD-10-CM | POA: Diagnosis not present

## 2016-04-30 DIAGNOSIS — Z79899 Other long term (current) drug therapy: Secondary | ICD-10-CM | POA: Diagnosis not present

## 2016-04-30 DIAGNOSIS — G894 Chronic pain syndrome: Secondary | ICD-10-CM | POA: Diagnosis present

## 2016-04-30 DIAGNOSIS — Z5181 Encounter for therapeutic drug level monitoring: Secondary | ICD-10-CM | POA: Diagnosis not present

## 2016-04-30 MED ORDER — OXYCODONE HCL 10 MG PO TABS: 10.0000 mg | ORAL_TABLET | Freq: Four times a day (QID) | ORAL | Status: DC | PRN

## 2016-04-30 NOTE — Progress Notes (Signed)
Subjective:    Patient ID: Logan Briggs, male    DOB: 10-04-1956, 60 y.o.   MRN: [id number]  HPI: Logan Briggs is a 60 year old male who returns for follow up for chronic pain and medication refill. He states his pain is located in his lower back radiating into his right lower extremity posteriorly. He rates his pain 7. His current exercise regime is walking, yardwork and performing stretching exercises. Logan Briggs states the co-pay for Lyrica would have been 400.00 dollars, he is unable to afford this medication due to financial hardship.  Pain Inventory Average Pain 7 Pain Right Now 7 My pain is sharp and stabbing  In the last 24 hours, has pain interfered with the following? General activity 5 Relation with others 5 Enjoyment of life 6 What TIME of day is your pain at its worst? evening and night Sleep (in general) Fair  Pain is worse with: some activites Pain improves with: heat/ice and medication Relief from Meds: 7  Mobility ability to climb steps?  yes do you drive?  yes Do you have any goals in this area?  no  Function Do you have any goals in this area?  no  Neuro/Psych No problems in this area  Prior Studies Any changes since last visit?  no  Physicians involved in your care Any changes since last visit?  no   History reviewed. No pertinent family history. Social History   Social History  . Marital Status: Married    Spouse Name: N/A  . Number of Children: N/A  . Years of Education: N/A   Social History Main Topics  . Smoking status: Current Every Day Smoker -- 1.50 packs/day for 20 years    Types: Cigarettes  . Smokeless tobacco: Never Used     Comment: desires to quit but starts again when upset or stressed  . Alcohol Use: Yes     Comment: only on holidays, beer  . Drug Use: No  . Sexual Activity: Not Asked   Other Topics Concern  . None   Social History Narrative   History reviewed. No pertinent past surgical history. Past  Medical History  Diagnosis Date  . Glaucoma    BP 143/85 mmHg  Pulse 65  SpO2 96%  Opioid Risk Score:   Fall Risk Score:  `1  Depression screen PHQ 2/9  Depression screen [facility] 2/9 07/21/2015 06/23/2015 05/22/2015  Decreased Interest 0 0 0  Down, Depressed, Hopeless 0 0 0  PHQ - 2 Score 0 0 0  Altered sleeping - - 3  Tired, decreased energy - - 0  Change in appetite - - 0  Feeling bad or failure about yourself  - - 3  Trouble concentrating - - 0  Moving slowly or fidgety/restless - - 3  Suicidal thoughts - - 0  PHQ-9 Score - - 9     Review of Systems     Objective:   Physical Exam  Constitutional: He is oriented to person, place, and time. He appears well-developed and well-nourished.  HENT:  Head: Normocephalic and atraumatic.  Neck: Normal range of motion. Neck supple.  Cardiovascular: Normal rate and regular rhythm.   Pulmonary/Chest: Effort normal and breath sounds normal.  Musculoskeletal:  Normal Muscle Bulk and Muscle Testing Reveals: Upper Extremities: Full ROM and Muscle Strength 5/5 Back without spinal tenderness Lower Extremities: Full ROM and Muscle Strength 5/5 Arises from chair with ease Narrow Based gait  Neurological: He is alert and oriented to  person, place, and time.  Skin: Skin is warm and dry.  Psychiatric: He has a normal mood and affect.  Nursing note and vitals reviewed.         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  Refilled: Oxycodone 10 mg one tablet 4 times a day as needed for pain #120. We will continue the opioid monitoring program, tis consists of regular clinic visits, examinations, urine drug screen, pill counts as well as use of [place] Controlled Substance Reporting System.  Continue with exercise regime. 2. Lumbar Radiculopathy: Resume Pamelor/ Will call on Monday June 26,2017 to evaluate 3. Muscle Spasm: Continue to monitor 4. Insomnia: Continue: Pamelor  20 minutes of face  to face patient care time was spent during this visit. All questions were encouraged and answered.   F/U in 1 month

## 2016-05-05 ENCOUNTER — Encounter (HOSPITAL_BASED_OUTPATIENT_CLINIC_OR_DEPARTMENT_OTHER): Payer: Self-pay | Admitting: *Deleted

## 2016-05-05 ENCOUNTER — Emergency Department (HOSPITAL_BASED_OUTPATIENT_CLINIC_OR_DEPARTMENT_OTHER)
Admission: EM | Admit: 2016-05-05 | Discharge: 2016-05-06 | Disposition: A | Discharge status: Home / Self Care | Payer: Medicare Other | Attending: Emergency Medicine | Admitting: Emergency Medicine

## 2016-05-05 DIAGNOSIS — Z79899 Other long term (current) drug therapy: Secondary | ICD-10-CM | POA: Diagnosis not present

## 2016-05-05 DIAGNOSIS — R300 Dysuria: Secondary | ICD-10-CM | POA: Diagnosis present

## 2016-05-05 DIAGNOSIS — Z711 Person with feared health complaint in whom no diagnosis is made: Secondary | ICD-10-CM

## 2016-05-05 DIAGNOSIS — F17210 Nicotine dependence, cigarettes, uncomplicated: Secondary | ICD-10-CM | POA: Insufficient documentation

## 2016-05-05 DIAGNOSIS — Z113 Encounter for screening for infections with a predominantly sexual mode of transmission: Secondary | ICD-10-CM | POA: Diagnosis not present

## 2016-05-05 DIAGNOSIS — Z791 Long term (current) use of non-steroidal anti-inflammatories (NSAID): Secondary | ICD-10-CM | POA: Insufficient documentation

## 2016-05-05 HISTORY — DX: Dorsalgia, unspecified: M54.9

## 2016-05-05 HISTORY — DX: Other chronic pain: G89.29

## 2016-05-05 LAB — URINE MICROSCOPIC-ADD ON

## 2016-05-05 LAB — URINALYSIS, ROUTINE W REFLEX MICROSCOPIC
BILIRUBIN URINE: NEGATIVE
GLUCOSE, UA: NEGATIVE mg/dL
KETONES UR: 15 mg/dL — AB
Nitrite: NEGATIVE
PH: 6 (ref 5.0–8.0)
Protein, ur: NEGATIVE mg/dL
Specific Gravity, Urine: 1.024 (ref 1.005–1.030)

## 2016-05-05 MED ORDER — CEFTRIAXONE SODIUM 250 MG IJ SOLR
250.0000 mg | Freq: Once | INTRAMUSCULAR | Status: AC
  Administered 2016-05-05: 250 mg via INTRAMUSCULAR
  Filled 2016-05-05: qty 250

## 2016-05-05 MED ORDER — AZITHROMYCIN 250 MG PO TABS
1000.0000 mg | ORAL_TABLET | Freq: Once | ORAL | Status: AC
  Administered 2016-05-05: 1000 mg via ORAL
  Filled 2016-05-05: qty 4

## 2016-05-05 NOTE — ED Provider Notes (Signed)
CSN: [id number]     Arrival date & time 05/05/16  2133 History  By signing my name below, I, Logan Briggs, attest that this documentation has been prepared under the direction and in the presence of Logan Hacker, MD. Electronically Signed: Soijett Briggs, ED Scribe. 05/05/2016. 1:09 AM.   Chief Complaint  Patient presents with  . Dysuria      The history is provided by the patient. No language interpreter was used.    HPI Comments: Logan Briggs is a 60 y.o. male who presents to the Emergency Department complaining of dark urine onset 2 weeks. Pt reports that he has chronic left lower leg pain that has worsened since the onset of his urinary symptoms. Pt notes that his left lower leg pain radiates to his left hip.  Pt states that his left leg pain is worsened with movement and he rates his left leg pain as 5/10 at this time. Pt reports that he had back surgery in the past that he takes oxycodone with relief of his chronic back pain. Pt states that he is in pain management and was seen 5 days ago. Pt notes that he was mowing his lawn when he stepped into a hole with his symptoms occurring 1 week prior.He states that he has tried oxycodone, lyrica, and notriptyline, with no relief for his symptoms. Pt also states that he had sexual intercourse prior to the onset of his urinary symptoms. Pt notes that he is concerned for STD's at this time. Denies speaking with his PCP about his symptoms.  He denies fever, dysuria, weakness, and any other symptoms.    Past Medical History  Diagnosis Date  . Glaucoma   . Chronic back pain    Past Surgical History  Procedure Laterality Date  . Back surgery     History reviewed. No pertinent family history. Social History  Substance Use Topics  . Smoking status: Current Every Day Smoker -- 1.00 packs/day for 20 years    Types: Cigarettes  . Smokeless tobacco: Never Used     Comment: desires to quit but starts again when upset or stressed  . Alcohol  Use: Yes     Comment: only on holidays, beer    Review of Systems  Constitutional: Negative for fever.  Genitourinary: Positive for dysuria. Negative for discharge.       Dark urine  Musculoskeletal: Positive for arthralgias (chronic left lower leg).  Neurological: Negative for weakness.  All other systems reviewed and are negative.   Allergies  Penicillins  Home Medications   Prior to Admission medications   Medication Sig Start Date End Date Taking? Authorizing Provider  baclofen (LIORESAL) 10 MG tablet Take 10 mg by mouth 3 (three) times daily.   Yes Historical Provider, MD  gabapentin (NEURONTIN) 100 MG capsule Take 100 mg by mouth 3 (three) times daily.   Yes Historical Provider, MD  nortriptyline (PAMELOR) 10 MG capsule Take 10 mg by mouth at bedtime.   Yes Historical Provider, MD  ciprofloxacin (CIPRO) 500 MG tablet Take 1 tablet (500 mg total) by mouth every 12 (twelve) hours. 05/06/16   Logan Hacker, MD  Oxycodone HCl 10 MG TABS Take 1 tablet (10 mg total) by mouth 4 (four) times daily as needed. 04/30/16   Bayard Hugger, NP  pregabalin (LYRICA) 75 MG capsule Take 1 capsule (75 mg total) by mouth 2 (two) times daily. 04/02/16   Charlett Blake, MD  tadalafil (CIALIS) 10 MG tablet Take  10 mg by mouth daily as needed.    Historical Provider, MD   BP 171/81 mmHg  Pulse 92  Temp(Src) 98.5 F (36.9 C) (Oral)  Resp 18  Ht '6\' 4"'$  (1.93 m)  Wt 235 lb (106.595 kg)  BMI 28.62 kg/m2  SpO2 99% Physical Exam  Constitutional: He is oriented to person, place, and time. He appears well-developed and well-nourished. No distress.  HENT:  Head: Normocephalic and atraumatic.  Cardiovascular: Normal rate, regular rhythm and normal heart sounds.   Pulmonary/Chest: Effort normal and breath sounds normal. No respiratory distress.  Abdominal: Soft. Bowel sounds are normal. There is no tenderness. There is no rebound.  Musculoskeletal: He exhibits no edema.  Normal range of motion  of the left knee and left hip, negative straight leg raise  Neurological: He is alert and oriented to person, place, and time.  5 out of 5 strength bilateral lower extremities, no clonus, normal reflexes  Skin: Skin is warm and dry.  Psychiatric: He has a normal mood and affect.  Nursing note and vitals reviewed.   ED Course  Procedures (including critical care time) DIAGNOSTIC STUDIES: Oxygen Saturation is 99% on RA, nl by my interpretation.    COORDINATION OF CARE: 11:41 PM Discussed treatment plan with pt at bedside which includes UA, abx Rx, HIV antibody, RPR, zithromax, rocephin, and pt agreed to plan.    Labs Review Labs Reviewed  URINALYSIS, ROUTINE W REFLEX MICROSCOPIC (NOT AT [facility]) - Abnormal; Notable for the following:    APPearance CLOUDY (*)    Hgb urine dipstick TRACE (*)    Ketones, ur 15 (*)    Leukocytes, UA MODERATE (*)    All other components within normal limits  URINE MICROSCOPIC-ADD ON - Abnormal; Notable for the following:    Squamous Epithelial / LPF 6-30 (*)    Bacteria, UA MANY (*)    All other components within normal limits  URINE CULTURE  GC/CHLAMYDIA PROBE AMP ([place]) NOT AT [facility]    I have personally reviewed and evaluated these lab results as part of my medical decision-making.    MDM   Final diagnoses:  Dysuria  Concern about STD in male without diagnosis    Patient presents with several complaints. He has chronic leg pain which seems to be exacerbated since his recent symptoms of dysuria. Does report new sexual contact and concern for STD. Urine does appear dirty. Urine culture sent. STD testing sent. Patient was empirically treated and will be discharged with ciprofloxacin for prostatitis.  Follow-up with pain management regarding chronic leg pain.  After history, exam, and medical workup I feel the patient has been appropriately medically screened and is safe for discharge home. Pertinent diagnoses were discussed with the patient.  Patient was given return precautions.  I personally performed the services described in this documentation, which was scribed in my presence. The recorded information has been reviewed and is accurate.    Logan Hacker, MD 05/06/16 [phone]

## 2016-05-05 NOTE — ED Notes (Signed)
Pt c/o urinary frequency and dark urine.  He also c/o left leg pain that starts in his buttock and radiates down his leg.  Pt denies abdominal pain.

## 2016-05-05 NOTE — ED Notes (Addendum)
Pt c/o lower left abd pain x 1 week. Pt enrolled with  pain management with pain clinic

## 2016-05-06 MED ORDER — CIPROFLOXACIN HCL 500 MG PO TABS: 500.0000 mg | ORAL_TABLET | Freq: Two times a day (BID) | ORAL | Status: DC

## 2016-05-06 NOTE — ED Notes (Signed)
Pt verbalizes understanding of d/c instructions and denies any further need at this time. 

## 2016-05-06 NOTE — Discharge Instructions (Signed)
You were seen today for urinary symptoms. Your urine does appear dirty. He'll be treated for STDs and prostatitis. If you develop fever or back pain you should be reevaluated. Follow-up with your pain management doctor regarding your chronic left leg pain.  Dysuria Dysuria is pain or discomfort while urinating. The pain or discomfort may be felt in the tube that carries urine out of the bladder (urethra) or in the surrounding tissue of the genitals. The pain may also be felt in the groin area, lower abdomen, and lower back. You may have to urinate frequently or have the sudden feeling that you have to urinate (urgency). Dysuria can affect both men and women, but is more common in women. Dysuria can be caused by many different things, including:  Urinary tract infection in women.  Infection of the kidney or bladder.  Kidney stones or bladder stones.  Certain sexually transmitted infections (STIs), such as chlamydia.  Dehydration.  Inflammation of the vagina.  Use of certain medicines.  Use of certain soaps or scented products that cause irritation. HOME CARE INSTRUCTIONS Watch your dysuria for any changes. The following actions may help to reduce any discomfort you are feeling:  Drink enough fluid to keep your urine clear or pale yellow.  Empty your bladder often. Avoid holding urine for long periods of time.  After a bowel movement or urination, women should cleanse from front to back, using each tissue only once.  Empty your bladder after sexual intercourse.  Take medicines only as directed by your health care provider.  If you were prescribed an antibiotic medicine, finish it all even if you start to feel better.  Avoid caffeine, tea, and alcohol. They can irritate the bladder and make dysuria worse. In men, alcohol may irritate the prostate.  Keep all follow-up visits as directed by your health care provider. This is important.  If you had any tests done to find the cause  of dysuria, it is your responsibility to obtain your test results. Ask the lab or department performing the test when and how you will get your results. Talk with your health care provider if you have any questions about your results. SEEK MEDICAL CARE IF:  You develop pain in your back or sides.  You have a fever.  You have nausea or vomiting.  You have blood in your urine.  You are not urinating as often as you usually do. SEEK IMMEDIATE MEDICAL CARE IF:  You pain is severe and not relieved with medicines.  You are unable to hold down any fluids.  You or someone else notices a change in your mental function.  You have a rapid heartbeat at rest.  You have shaking or chills.  You feel extremely weak.   This information is not intended to replace advice given to you by your health care provider. Make sure you discuss any questions you have with your health care provider.   Document Released: 07/23/2004 Document Revised: 11/15/2014 Document Reviewed: 06/20/2014 Elsevier Interactive Patient Education [employer]. Sexually Transmitted Disease A sexually transmitted disease (STD) is a disease or infection that may be passed (transmitted) from person to person, usually during sexual activity. This may happen by way of saliva, semen, blood, vaginal mucus, or urine. Common STDs include:  Gonorrhea.  Chlamydia.  Syphilis.  HIV and AIDS.  Genital herpes.  Hepatitis B and C.  Trichomonas.  Human papillomavirus (HPV).  Pubic lice.  Scabies.  Mites.  Bacterial vaginosis. WHAT ARE CAUSES OF STDs?  An STD may be caused by bacteria, a virus, or parasites. STDs are often transmitted during sexual activity if one person is infected. However, they may also be transmitted through nonsexual means. STDs may be transmitted after:   Sexual intercourse with an infected person.  Sharing sex toys with an infected person.  Sharing needles with an infected person or using  unclean piercing or tattoo needles.  Having intimate contact with the genitals, mouth, or rectal areas of an infected person.  Exposure to infected fluids during birth. WHAT ARE THE SIGNS AND SYMPTOMS OF STDs? Different STDs have different symptoms. Some people may not have any symptoms. If symptoms are present, they may include:  Painful or bloody urination.  Pain in the pelvis, abdomen, vagina, anus, throat, or eyes.  A skin rash, itching, or irritation.  Growths, ulcerations, blisters, or sores in the genital and anal areas.  Abnormal vaginal discharge with or without bad odor.  Penile discharge in men.  Fever.  Pain or bleeding during sexual intercourse.  Swollen glands in the groin area.  Yellow skin and eyes (jaundice). This is seen with hepatitis.  Swollen testicles.  Infertility.  Sores and blisters in the mouth. HOW ARE STDs DIAGNOSED? To make a diagnosis, your health care provider may:  Take a medical history.  Perform a physical exam.  Take a sample of any discharge to examine.  Swab the throat, cervix, opening to the penis, rectum, or vagina for testing.  Test a sample of your first morning urine.  Perform blood tests.  Perform a Pap test, if this applies.  Perform a colposcopy.  Perform a laparoscopy. HOW ARE STDs TREATED? Treatment depends on the STD. Some STDs may be treated but not cured.  Chlamydia, gonorrhea, trichomonas, and syphilis can be cured with antibiotic medicine.  Genital herpes, hepatitis, and HIV can be treated, but not cured, with prescribed medicines. The medicines lessen symptoms.  Genital warts from HPV can be treated with medicine or by freezing, burning (electrocautery), or surgery. Warts may come back.  HPV cannot be cured with medicine or surgery. However, abnormal areas may be removed from the cervix, vagina, or vulva.  If your diagnosis is confirmed, your recent sexual partners need treatment. This is true even if  they are symptom-free or have a negative culture or evaluation. They should not have sex until their health care providers say it is okay.  Your health care provider may test you for infection again 3 months after treatment. HOW CAN I REDUCE MY RISK OF GETTING AN STD? Take these steps to reduce your risk of getting an STD:  Use latex condoms, dental dams, and water-soluble lubricants during sexual activity. Do not use petroleum jelly or oils.  Avoid having multiple sex partners.  Do not have sex with someone who has other sex partners  Do not have sex with anyone you do not know or who is at high risk for an STD.  Avoid risky sex practices that can break your skin.  Do not have sex if you have open sores on your mouth or skin.  Avoid drinking too much alcohol or taking illegal drugs. Alcohol and drugs can affect your judgment and put you in a vulnerable position.  Avoid engaging in oral and anal sex acts.  Get vaccinated for HPV and hepatitis. If you have not received these vaccines in the past, talk to your health care provider about whether one or both might be right for you.  If you are  at risk of being infected with HIV, it is recommended that you take a prescription medicine daily to prevent HIV infection. This is called pre-exposure prophylaxis (PrEP). You are considered at risk if:  You are a man who has sex with other men (MSM).  You are a heterosexual man or woman and are sexually active with more than one partner.  You take drugs by injection.  You are sexually active with a partner who has HIV.  Talk with your health care provider about whether you are at high risk of being infected with HIV. If you choose to begin PrEP, you should first be tested for HIV. You should then be tested every 3 months for as long as you are taking PrEP. WHAT SHOULD I DO IF I THINK I HAVE AN STD?  See your health care provider.  Tell your sexual partner(s). They should be tested and  treated for any STDs.  Do not have sex until your health care provider says it is okay. WHEN SHOULD I GET IMMEDIATE MEDICAL CARE? Contact your health care provider right away if:   You have severe abdominal pain.  You are a man and notice swelling or pain in your testicles.  You are a woman and notice swelling or pain in your vagina.   This information is not intended to replace advice given to you by your health care provider. Make sure you discuss any questions you have with your health care provider.   Document Released: 01/15/2003 Document Revised: 11/15/2014 Document Reviewed: 05/15/2013 Elsevier Interactive Patient Education [employer].

## 2016-05-07 LAB — URINE CULTURE

## 2016-05-07 LAB — GC/CHLAMYDIA PROBE AMP (~~LOC~~) NOT AT ARMC
Chlamydia: NEGATIVE
Neisseria Gonorrhea: NEGATIVE

## 2016-05-08 LAB — TOXASSURE SELECT,+ANTIDEPR,UR: PDF: 0

## 2016-05-14 NOTE — Progress Notes (Signed)
Urine drug screen for this encounter is consistent for prescribed medications.   

## 2016-05-28 ENCOUNTER — Encounter: Payer: Medicare Other | Attending: Physical Medicine & Rehabilitation | Admitting: Registered Nurse

## 2016-05-28 ENCOUNTER — Encounter: Payer: Self-pay | Admitting: Registered Nurse

## 2016-05-28 VITALS — BP 127/75 | HR 71 | Resp 14

## 2016-05-28 DIAGNOSIS — M961 Postlaminectomy syndrome, not elsewhere classified: Secondary | ICD-10-CM

## 2016-05-28 DIAGNOSIS — Z79899 Other long term (current) drug therapy: Secondary | ICD-10-CM | POA: Diagnosis present

## 2016-05-28 DIAGNOSIS — M62838 Other muscle spasm: Secondary | ICD-10-CM

## 2016-05-28 DIAGNOSIS — G894 Chronic pain syndrome: Secondary | ICD-10-CM | POA: Diagnosis present

## 2016-05-28 DIAGNOSIS — M5416 Radiculopathy, lumbar region: Secondary | ICD-10-CM

## 2016-05-28 DIAGNOSIS — Z5181 Encounter for therapeutic drug level monitoring: Secondary | ICD-10-CM | POA: Diagnosis present

## 2016-05-28 MED ORDER — OXYCODONE HCL 10 MG PO TABS: 10.0000 mg | ORAL_TABLET | Freq: Four times a day (QID) | ORAL | Status: DC | PRN

## 2016-05-28 NOTE — Progress Notes (Signed)
Subjective:    Patient ID: Logan Briggs, male    DOB: 1956-05-02, 60 y.o.   MRN: [id number]  HPI: Logan Briggs is a 60 year old male who returns for follow up for chronic pain and medication refill. He states his pain is located in his lower back radiating into his left lower extremity laterally. Also states his nerve pain has increased in intensity will increase gabapentin to 200 mg QHS. He rates his pain 7. His current exercise regime is walking, yardwork and performing stretching exercises. Logan Briggs went to [facility] for Dysuria.   Pain Inventory Average Pain 7 Pain Right Now 7 My pain is sharp and stabbing  In the last 24 hours, has pain interfered with the following? General activity 5 Relation with others 5 Enjoyment of life 6 What TIME of day is your pain at its worst? evening, night  Sleep (in general) Fair  Pain is worse with: some activites Pain improves with: heat/ice and medication Relief from Meds: 7  Mobility walk without assistance ability to climb steps?  yes do you drive?  yes Do you have any goals in this area?  no  Function Do you have any goals in this area?  no  Neuro/Psych No problems in this area  Prior Studies Any changes since last visit?  no  Physicians involved in your care Any changes since last visit?  no   History reviewed. No pertinent family history. Social History   Social History  . Marital Status: Married    Spouse Name: N/A  . Number of Children: N/A  . Years of Education: N/A   Social History Main Topics  . Smoking status: Current Every Day Smoker -- 1.00 packs/day for 20 years    Types: Cigarettes  . Smokeless tobacco: Never Used     Comment: desires to quit but starts again when upset or stressed  . Alcohol Use: Yes     Comment: only on holidays, beer  . Drug Use: No  . Sexual Activity: Not Asked   Other Topics Concern  . None   Social History Narrative   Past Surgical History    Procedure Laterality Date  . Back surgery     Past Medical History  Diagnosis Date  . Glaucoma   . Chronic back pain    BP 127/75 mmHg  Pulse 71  Resp 14  SpO2 93%  Opioid Risk Score:   Fall Risk Score:  `1  Depression screen PHQ 2/9  Depression screen [facility] 2/9 07/21/2015 06/23/2015 05/22/2015  Decreased Interest 0 0 0  Down, Depressed, Hopeless 0 0 0  PHQ - 2 Score 0 0 0  Altered sleeping - - 3  Tired, decreased energy - - 0  Change in appetite - - 0  Feeling bad or failure about yourself  - - 3  Trouble concentrating - - 0  Moving slowly or fidgety/restless - - 3  Suicidal thoughts - - 0  PHQ-9 Score - - 9     Review of Systems  Eyes: Negative.   Respiratory: Negative.   Cardiovascular: Negative.   Gastrointestinal: Negative.   Endocrine: Negative.   Genitourinary: Negative.   Musculoskeletal: Positive for myalgias.  Allergic/Immunologic: Negative.   Neurological: Negative.   Psychiatric/Behavioral: Negative.        Objective:   Physical Exam  Constitutional: He is oriented to person, place, and time. He appears well-developed and well-nourished.  HENT:  Head: Normocephalic and atraumatic.  Neck:  Normal range of motion. Neck supple.  Cardiovascular: Normal rate and regular rhythm.   Pulmonary/Chest: Effort normal and breath sounds normal.  Musculoskeletal:  Normal Muscle Bulk and Muscle Testing Reveals: Upper Extremities: Full ROM and Muscle Strength 5/5 Back without spinal tenderness Lower Extremities: Full ROM and Muscle Strength 5/5 Arises from chair with ease Narrow Based Gait  Neurological: He is alert and oriented to person, place, and time.  Skin: Skin is warm and dry.  Psychiatric: He has a normal mood and affect.  Nursing note and vitals reviewed.         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  Refilled: Oxycodone 10 mg one tablet 4 times a day as needed for pain #120. We will continue the opioid  monitoring program, tis consists of regular clinic visits, examinations, urine drug screen, pill counts as well as use of [place] Controlled Substance Reporting System.  Continue with exercise regime. 2. Lumbar Radiculopathy:Increased Gabapentin 200 mg QHS. Continue Pamelor/  3. Muscle Spasm: Continue Baclofen 4. Insomnia: Continue: Pamelor  20 minutes of face to face patient care time was spent during this visit. All questions were encouraged and answered.   F/U in 1 month

## 2016-05-28 NOTE — Patient Instructions (Signed)
Take Your Gabapentin One Capsule in the Morning One in the Afternoon and Two capsules at Bedtime.  Call office in a week. [phone]

## 2016-06-09 ENCOUNTER — Other Ambulatory Visit: Payer: Self-pay | Admitting: Registered Nurse

## 2016-06-09 NOTE — Telephone Encounter (Signed)
Please advise on refill? I do not see specific documentation on baclofen (dose, frequency). Also, the med list states it was lasted as a historic med. No current documentation there as well besides TID.Marland KitchenMarland Kitchen

## 2016-07-01 ENCOUNTER — Ambulatory Visit: Payer: Medicare Other | Admitting: Registered Nurse

## 2016-07-09 ENCOUNTER — Encounter: Payer: Medicare Other | Attending: Physical Medicine & Rehabilitation | Admitting: Registered Nurse

## 2016-07-09 ENCOUNTER — Encounter: Payer: Self-pay | Admitting: Registered Nurse

## 2016-07-09 VITALS — BP 117/72 | HR 78

## 2016-07-09 DIAGNOSIS — Z79899 Other long term (current) drug therapy: Secondary | ICD-10-CM | POA: Diagnosis not present

## 2016-07-09 DIAGNOSIS — M5416 Radiculopathy, lumbar region: Secondary | ICD-10-CM | POA: Diagnosis not present

## 2016-07-09 DIAGNOSIS — M62838 Other muscle spasm: Secondary | ICD-10-CM | POA: Diagnosis not present

## 2016-07-09 DIAGNOSIS — G894 Chronic pain syndrome: Secondary | ICD-10-CM | POA: Insufficient documentation

## 2016-07-09 DIAGNOSIS — Z5181 Encounter for therapeutic drug level monitoring: Secondary | ICD-10-CM | POA: Insufficient documentation

## 2016-07-09 DIAGNOSIS — M961 Postlaminectomy syndrome, not elsewhere classified: Secondary | ICD-10-CM | POA: Diagnosis not present

## 2016-07-09 MED ORDER — BACLOFEN 10 MG PO TABS: 10.0000 mg | ORAL_TABLET | Freq: Two times a day (BID) | ORAL | 3 refills | Status: DC | PRN

## 2016-07-09 MED ORDER — OXYCODONE HCL 10 MG PO TABS: 10.0000 mg | ORAL_TABLET | Freq: Four times a day (QID) | ORAL | 0 refills | Status: DC | PRN

## 2016-07-09 NOTE — Progress Notes (Signed)
Subjective:    Patient ID: Logan Briggs, male    DOB: Dec 11, 1955, 60 y.o.   MRN: [id number]  HPI:  Logan Briggs is a 60 year old male who returns for follow up for chronic pain and medication refill. He states his pain is located in his lower back radiating into his left lower extremity laterally.  He rates his pain 7. His current exercise regime is walking, yardwork and performing stretching exercises.  Pain Inventory Average Pain 7 Pain Right Now 7 My pain is sharp and stabbing  In the last 24 hours, has pain interfered with the following? General activity 5 Relation with others 5 Enjoyment of life 6 What TIME of day is your pain at its worst? evening and night Sleep (in general) Fair  Pain is worse with: some activites Pain improves with: heat/ice and medication Relief from Meds: 7  Mobility ability to climb steps?  yes do you drive?  yes Do you have any goals in this area?  no  Function Do you have any goals in this area?  no  Neuro/Psych No problems in this area  Prior Studies Any changes since last visit?  no  Physicians involved in your care Any changes since last visit?  no   History reviewed. No pertinent family history. Social History   Social History  . Marital status: Married    Spouse name: N/A  . Number of children: N/A  . Years of education: N/A   Social History Main Topics  . Smoking status: Current Every Day Smoker    Packs/day: 1.00    Years: 20.00    Types: Cigarettes  . Smokeless tobacco: Never Used     Comment: desires to quit but starts again when upset or stressed  . Alcohol use Yes     Comment: only on holidays, beer  . Drug use: No  . Sexual activity: Not Asked   Other Topics Concern  . None   Social History Narrative  . None   Past Surgical History:  Procedure Laterality Date  . BACK SURGERY     Past Medical History:  Diagnosis Date  . Chronic back pain   . Glaucoma    BP 117/72   Pulse 78   SpO2 94%     Opioid Risk Score:   Fall Risk Score:  `1  Depression screen PHQ 2/9  Depression screen [facility] 2/9 07/21/2015 06/23/2015 05/22/2015  Decreased Interest 0 0 0  Down, Depressed, Hopeless 0 0 0  PHQ - 2 Score 0 0 0  Altered sleeping - - 3  Tired, decreased energy - - 0  Change in appetite - - 0  Feeling bad or failure about yourself  - - 3  Trouble concentrating - - 0  Moving slowly or fidgety/restless - - 3  Suicidal thoughts - - 0  PHQ-9 Score - - 9    Review of Systems  Constitutional: Negative.   HENT: Negative.   Eyes: Negative.   Respiratory: Negative.   Cardiovascular: Negative.   Gastrointestinal: Negative.   Endocrine: Negative.   Genitourinary: Negative.   Musculoskeletal: Negative.   Skin: Negative.   Allergic/Immunologic: Negative.   Neurological: Negative.   Hematological: Negative.   Psychiatric/Behavioral: Negative.        Objective:   Physical Exam  Constitutional: He is oriented to person, place, and time. He appears well-developed and well-nourished.  HENT:  Head: Normocephalic and atraumatic.  Neck: Normal range of motion. Neck supple.  Cardiovascular:  Normal rate and regular rhythm.   Pulmonary/Chest: Effort normal and breath sounds normal.  Musculoskeletal:  Normal Muscle Bulk and Muscle Testing Reveals: Upper Extremities: Full ROM and Muscle Strength 5/5 Back without spinal tenderness Lower Extremities: Full ROM and Muscle Strength 5/5 Arises from table with ease Narrow Based Gait  Neurological: He is alert and oriented to person, place, and time.  Skin: Skin is warm and dry.  Psychiatric: He has a normal mood and affect.  Nursing note and vitals reviewed.         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  Refilled: Oxycodone 10 mg one tablet 4 times a day as needed for pain #120. We will continue the opioid monitoring program, tis consists of regular clinic visits, examinations, urine drug screen,  pill counts as well as use of [place] Controlled Substance Reporting System.  Continue with exercise regime. 2. Lumbar Radiculopathy: Continue Gabapentin and Pamelor 3. Muscle Spasm: Continue Baclofen 4. Insomnia: Continue: Pamelor  20 minutes of face to face patient care time was spent during this visit. All questions were encouraged and answered.   F/U in 1 month

## 2016-08-09 ENCOUNTER — Encounter: Payer: Self-pay | Admitting: Registered Nurse

## 2016-08-09 ENCOUNTER — Encounter: Payer: Medicare Other | Attending: Physical Medicine & Rehabilitation | Admitting: Registered Nurse

## 2016-08-09 VITALS — BP 120/75 | HR 76

## 2016-08-09 DIAGNOSIS — G894 Chronic pain syndrome: Secondary | ICD-10-CM | POA: Diagnosis present

## 2016-08-09 DIAGNOSIS — Z79899 Other long term (current) drug therapy: Secondary | ICD-10-CM | POA: Insufficient documentation

## 2016-08-09 DIAGNOSIS — M5416 Radiculopathy, lumbar region: Secondary | ICD-10-CM

## 2016-08-09 DIAGNOSIS — M62838 Other muscle spasm: Secondary | ICD-10-CM | POA: Diagnosis not present

## 2016-08-09 DIAGNOSIS — Z5181 Encounter for therapeutic drug level monitoring: Secondary | ICD-10-CM | POA: Insufficient documentation

## 2016-08-09 DIAGNOSIS — M961 Postlaminectomy syndrome, not elsewhere classified: Secondary | ICD-10-CM

## 2016-08-09 MED ORDER — OXYCODONE HCL 10 MG PO TABS: 10.0000 mg | ORAL_TABLET | Freq: Four times a day (QID) | ORAL | 0 refills | Status: DC | PRN

## 2016-08-09 NOTE — Progress Notes (Signed)
Subjective:    Patient ID: Logan Briggs, male    DOB: July 21, 1956, 60 y.o.   MRN: [id number]  HPI: [name] is a 60 year old male who returns for follow up for chronic pain and medication refill. He states his pain is located in his lower back radiating into his left lower extremity laterally.  He rates his pain 7. His current exercise regime is walking, yardwork and performing stretching exercises.  Pain Inventory Average Pain 7 Pain Right Now 7 My pain is sharp and stabbing  In the last 24 hours, has pain interfered with the following? General activity 5 Relation with others 5 Enjoyment of life 6 What TIME of day is your pain at its worst? evening and night Sleep (in general) Good  Pain is worse with: some activites Pain improves with: heat/ice and medication Relief from Meds: 7  Mobility ability to climb steps?  yes do you drive?  yes Do you have any goals in this area?  no  Function Do you have any goals in this area?  no  Neuro/Psych No problems in this area  Prior Studies Any changes since last visit?  no  Physicians involved in your care Any changes since last visit?  no   History reviewed. No pertinent family history. Social History   Social History  . Marital status: Married    Spouse name: N/A  . Number of children: N/A  . Years of education: N/A   Social History Main Topics  . Smoking status: Current Every Day Smoker    Packs/day: 1.00    Years: 20.00    Types: Cigarettes  . Smokeless tobacco: Never Used     Comment: desires to quit but starts again when upset or stressed  . Alcohol use Yes     Comment: only on holidays, beer  . Drug use: No  . Sexual activity: Not Asked   Other Topics Concern  . None   Social History Narrative  . None   Past Surgical History:  Procedure Laterality Date  . BACK SURGERY     Past Medical History:  Diagnosis Date  . Chronic back pain   . Glaucoma    There were no vitals taken for this  visit.  Opioid Risk Score:   Fall Risk Score:  `1  Depression screen PHQ 2/9  Depression screen [facility] 2/9 07/21/2015 06/23/2015 05/22/2015  Decreased Interest 0 0 0  Down, Depressed, Hopeless 0 0 0  PHQ - 2 Score 0 0 0  Altered sleeping - - 3  Tired, decreased energy - - 0  Change in appetite - - 0  Feeling bad or failure about yourself  - - 3  Trouble concentrating - - 0  Moving slowly or fidgety/restless - - 3  Suicidal thoughts - - 0  PHQ-9 Score - - 9      Review of Systems  Constitutional: Negative.   HENT: Negative.   Eyes: Negative.   Respiratory: Negative.   Cardiovascular: Negative.   Gastrointestinal: Negative.   Endocrine: Negative.   Genitourinary: Negative.   Musculoskeletal: Negative.   Skin: Negative.   Allergic/Immunologic: Negative.   Neurological: Negative.   Hematological: Negative.   Psychiatric/Behavioral: Negative.        Objective:   Physical Exam  Constitutional: He is oriented to person, place, and time. He appears well-developed and well-nourished.  HENT:  Head: Normocephalic and atraumatic.  Neck: Normal range of motion. Neck supple.  Cardiovascular: Normal rate and  regular rhythm.   Pulmonary/Chest: Effort normal and breath sounds normal.  Musculoskeletal:  Normal Muscle Bulk and Muscle Testing Reveals: Upper Extremities: Full ROM and Muscle Strength 5/5 Back without spinal tenderness Lower Extremities: Full ROM and Muscle Strength 5/5 Arises from table with ease Narrow Based Gait  Neurological: He is alert and oriented to person, place, and time.  Skin: Skin is warm and dry.  Psychiatric: He has a normal mood and affect.  Nursing note and vitals reviewed.         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  Refilled: Oxycodone 10 mg one tablet 4 times a day as needed for pain #120. We will continue the opioid monitoring program, tis consists of regular clinic visits, examinations, urine drug  screen, pill counts as well as use of [place] Controlled Substance Reporting System.  Continue with exercise regime. 2. Lumbar Radiculopathy: Continue Gabapentin and Pamelor 3. Muscle Spasm: Continue Baclofen 4. Insomnia: Continue: Pamelor  20 minutes of face to face patient care time was spent during this visit. All questions were encouraged and answered.   F/U in 1 month

## 2016-09-09 ENCOUNTER — Encounter: Payer: Self-pay | Admitting: Physical Medicine & Rehabilitation

## 2016-09-09 ENCOUNTER — Encounter: Payer: Medicare Other | Attending: Physical Medicine & Rehabilitation

## 2016-09-09 ENCOUNTER — Ambulatory Visit (HOSPITAL_BASED_OUTPATIENT_CLINIC_OR_DEPARTMENT_OTHER): Payer: Medicare Other | Admitting: Physical Medicine & Rehabilitation

## 2016-09-09 VITALS — BP 143/83 | HR 64

## 2016-09-09 DIAGNOSIS — M5416 Radiculopathy, lumbar region: Secondary | ICD-10-CM | POA: Diagnosis not present

## 2016-09-09 DIAGNOSIS — Z79899 Other long term (current) drug therapy: Secondary | ICD-10-CM

## 2016-09-09 DIAGNOSIS — M961 Postlaminectomy syndrome, not elsewhere classified: Secondary | ICD-10-CM

## 2016-09-09 DIAGNOSIS — G894 Chronic pain syndrome: Secondary | ICD-10-CM | POA: Insufficient documentation

## 2016-09-09 DIAGNOSIS — Z5181 Encounter for therapeutic drug level monitoring: Secondary | ICD-10-CM | POA: Insufficient documentation

## 2016-09-09 DIAGNOSIS — M62838 Other muscle spasm: Secondary | ICD-10-CM | POA: Diagnosis not present

## 2016-09-09 MED ORDER — OXYCODONE HCL 10 MG PO TABS: 10.0000 mg | ORAL_TABLET | Freq: Four times a day (QID) | ORAL | 0 refills | Status: DC | PRN

## 2016-09-09 NOTE — Progress Notes (Signed)
Subjective:    Patient ID: Logan Briggs, male    DOB: 1956/10/17, 60 y.o.   MRN: [id number]  HPI  60 year old male with history of lumbar laminectomy 2006, followed by lumbar fusion 2008. He is a chronic postoperative pain with intermittent pain going down the left lower extremity over the last several years. This pain bothers him mainly at night. He takes as needed gabapentin for this since it does not occur on a daily basis. He does have daily constant pain in the low back area and takes oxycodone 10 mg, 4 times per day Pain Inventory Average Pain 7 Pain Right Now 7 My pain is sharp and stabbing  In the last 24 hours, has pain interfered with the following? General activity 5 Relation with others 5 Enjoyment of life 6 What TIME of day is your pain at its worst? evening, night Sleep (in general) Good  Pain is worse with: some activites Pain improves with: heat/ice and medication Relief from Meds: 7  Mobility ability to climb steps?  yes do you drive?  yes Do you have any goals in this area?  no  Function Do you have any goals in this area?  no  Neuro/Psych No problems in this area  Prior Studies Any changes since last visit?  no bone scan x-rays CT/MRI nerve study  Physicians involved in your care Any changes since last visit?  no   No family history on file. Social History   Social History  . Marital status: Married    Spouse name: N/A  . Number of children: N/A  . Years of education: N/A   Social History Main Topics  . Smoking status: Current Every Day Smoker    Packs/day: 1.00    Years: 20.00    Types: Cigarettes  . Smokeless tobacco: Never Used     Comment: desires to quit but starts again when upset or stressed  . Alcohol use Yes     Comment: only on holidays, beer  . Drug use: No  . Sexual activity: Not on file   Other Topics Concern  . Not on file   Social History Narrative  . No narrative on file   Past Surgical History:    Procedure Laterality Date  . BACK SURGERY     Past Medical History:  Diagnosis Date  . Chronic back pain   . Glaucoma    There were no vitals taken for this visit.  Opioid Risk Score:   Fall Risk Score:  `1  Depression screen PHQ 2/9  Depression screen [facility] 2/9 07/21/2015 06/23/2015 05/22/2015  Decreased Interest 0 0 0  Down, Depressed, Hopeless 0 0 0  PHQ - 2 Score 0 0 0  Altered sleeping - - 3  Tired, decreased energy - - 0  Change in appetite - - 0  Feeling bad or failure about yourself  - - 3  Trouble concentrating - - 0  Moving slowly or fidgety/restless - - 3  Suicidal thoughts - - 0  PHQ-9 Score - - 9     Review of Systems  All other systems reviewed and are negative.      Objective:   Physical Exam  Constitutional: He is oriented to person, place, and time. He appears well-developed and well-nourished.  HENT:  Head: Normocephalic and atraumatic.  Eyes: Conjunctivae and EOM are normal. Pupils are equal, round, and reactive to light.  Neck: Normal range of motion.  Musculoskeletal:       Thoracic  back: He exhibits decreased range of motion. He exhibits no tenderness, no bony tenderness and no deformity.       Lumbar back: He exhibits decreased range of motion. He exhibits no bony tenderness and no deformity.  Decreased lumbar range of motion, 25% flexion, extension, lateral bending and rotation. Negative straight leg raising  Neurological: He is alert and oriented to person, place, and time. He has normal strength. Gait abnormal.  Antalgic gait mildly forward flexed  Psychiatric: He has a normal mood and affect.  Nursing note and vitals reviewed.         Assessment & Plan:  1. 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 He has very good relief with oxycodone 10 mg 4 times a day He takes his Gabapentin 100 g daily at bedtime when necessary when pain interferes with sleep. Last UDS 04/30/2016 was consistent   Continue  opioid monitoring program. This consists of regular clinic visits, examinations, urine drug screen, pill counts as well as use of [place] controlled substance reporting System.   Nurse practitioner visit one month M.D. Visit in 6 months

## 2016-09-09 NOTE — Addendum Note (Signed)
Addended by: Valere Dross on: 09/09/2016 03:29 PM   Modules accepted: Orders

## 2016-09-09 NOTE — Patient Instructions (Signed)
Please walk 41mn 3 times a week

## 2016-09-16 LAB — TOXASSURE SELECT,+ANTIDEPR,UR

## 2016-09-20 NOTE — Progress Notes (Signed)
Urine drug screen for this encounter is consistent for prescribed medication 

## 2016-10-08 ENCOUNTER — Encounter: Payer: Self-pay | Admitting: Registered Nurse

## 2016-10-08 ENCOUNTER — Encounter: Payer: Medicare Other | Attending: Physical Medicine & Rehabilitation | Admitting: Registered Nurse

## 2016-10-08 ENCOUNTER — Encounter (INDEPENDENT_AMBULATORY_CARE_PROVIDER_SITE_OTHER): Payer: Self-pay

## 2016-10-08 VITALS — BP 120/76 | HR 74

## 2016-10-08 DIAGNOSIS — G894 Chronic pain syndrome: Secondary | ICD-10-CM | POA: Insufficient documentation

## 2016-10-08 DIAGNOSIS — Z79899 Other long term (current) drug therapy: Secondary | ICD-10-CM | POA: Diagnosis not present

## 2016-10-08 DIAGNOSIS — M62838 Other muscle spasm: Secondary | ICD-10-CM | POA: Diagnosis not present

## 2016-10-08 DIAGNOSIS — Z5181 Encounter for therapeutic drug level monitoring: Secondary | ICD-10-CM | POA: Diagnosis present

## 2016-10-08 DIAGNOSIS — M961 Postlaminectomy syndrome, not elsewhere classified: Secondary | ICD-10-CM | POA: Diagnosis not present

## 2016-10-08 DIAGNOSIS — M5416 Radiculopathy, lumbar region: Secondary | ICD-10-CM

## 2016-10-08 MED ORDER — GABAPENTIN 100 MG PO CAPS: 100.0000 mg | ORAL_CAPSULE | Freq: Three times a day (TID) | ORAL | 2 refills | Status: DC

## 2016-10-08 MED ORDER — OXYCODONE HCL 10 MG PO TABS: 10.0000 mg | ORAL_TABLET | Freq: Four times a day (QID) | ORAL | 0 refills | Status: DC | PRN

## 2016-10-08 NOTE — Progress Notes (Signed)
Subjective:    Patient ID: Logan Briggs, male    DOB: 10-Jul-1956, 60 y.o.   MRN: [id number]  HPI: Logan Briggs is a 60 year old male who returns for follow up for chronic pain and medication refill. He states his pain is located in his lower back radiating into his left lower extremity laterally. He rates his pain 7. His current exercise regime is walking and performing stretching exercises.   Pain Inventory Average Pain 7 Pain Right Now 7 My pain is constant, sharp and stabbing  In the last 24 hours, has pain interfered with the following? General activity 5 Relation with others 5 Enjoyment of life 6 What TIME of day is your pain at its worst? evening, night Sleep (in general) Fair  Pain is worse with: . Pain improves with: medication Relief from Meds: 7  Mobility walk without assistance ability to climb steps?  yes do you drive?  yes Do you have any goals in this area?  no  Function disabled: date disabled . Do you have any goals in this area?  no  Neuro/Psych No problems in this area  Prior Studies Any changes since last visit?  no  Physicians involved in your care Any changes since last visit?  no   No family history on file. Social History   Social History  . Marital status: Married    Spouse name: N/A  . Number of children: N/A  . Years of education: N/A   Social History Main Topics  . Smoking status: Current Every Day Smoker    Packs/day: 1.00    Years: 20.00    Types: Cigarettes  . Smokeless tobacco: Never Used     Comment: desires to quit but starts again when upset or stressed  . Alcohol use Yes     Comment: only on holidays, beer  . Drug use: No  . Sexual activity: No   Other Topics Concern  . Not on file   Social History Narrative  . No narrative on file   Past Surgical History:  Procedure Laterality Date  . BACK SURGERY     Past Medical History:  Diagnosis Date  . Chronic back pain   . Glaucoma    There were no  vitals taken for this visit.  Opioid Risk Score:   Fall Risk Score:  `1  Depression screen PHQ 2/9  Depression screen [facility] 2/9 07/21/2015 06/23/2015 05/22/2015  Decreased Interest 0 0 0  Down, Depressed, Hopeless 0 0 0  PHQ - 2 Score 0 0 0  Altered sleeping - - 3  Tired, decreased energy - - 0  Change in appetite - - 0  Feeling bad or failure about yourself  - - 3  Trouble concentrating - - 0  Moving slowly or fidgety/restless - - 3  Suicidal thoughts - - 0  PHQ-9 Score - - 9   Review of Systems  Constitutional: Negative.   HENT: Negative.   Eyes: Negative.   Respiratory: Negative.   Cardiovascular: Negative.   Gastrointestinal: Negative.   Endocrine: Negative.   Genitourinary: Negative.   Musculoskeletal: Positive for back pain.  Skin: Negative.   Allergic/Immunologic: Negative.   Neurological: Negative.   Hematological: Negative.   Psychiatric/Behavioral: Negative.   All other systems reviewed and are negative.      Objective:   Physical Exam  Constitutional: He is oriented to person, place, and time. He appears well-developed and well-nourished.  HENT:  Head: Normocephalic and atraumatic.  Neck: Normal range of motion. Neck supple.  Cardiovascular: Normal rate and regular rhythm.   Pulmonary/Chest: Effort normal and breath sounds normal.  Musculoskeletal:  Normal Muscle Bulk and Muscle Testing Reveals: Upper Extremities: Full ROM and Muscle Strength 5/5 Back without spinal Tenderness Noted Lower Extremities: Full ROM and Muscle Strength 5/5 Arises from Table with ease Narrow Based Gait  Neurological: He is alert and oriented to person, place, and time.  Skin: Skin is warm and dry.  Psychiatric: He has a normal mood and affect.  Nursing note and vitals reviewed.         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  Refilled: Oxycodone 10 mg one tablet 4 times a day as needed for pain #120. We will continue the opioid  monitoring program, tis consists of regular clinic visits, examinations, urine drug screen, pill counts as well as use of [place] Controlled Substance Reporting System.  Continue with exercise regime. 2. Lumbar Radiculopathy: Continue Gabapentin andPamelor 3. Muscle Spasm: Continue Baclofen 4. Insomnia: Continue: Pamelor  20 minutes of face to face patient care time was spent during this visit. All questions were encouraged and answered.   F/U in 1 month

## 2016-11-09 ENCOUNTER — Encounter: Payer: Self-pay | Admitting: Registered Nurse

## 2016-11-09 ENCOUNTER — Encounter: Payer: Medicare PPO | Attending: Physical Medicine & Rehabilitation | Admitting: Registered Nurse

## 2016-11-09 VITALS — BP 129/76 | HR 64 | Resp 14

## 2016-11-09 DIAGNOSIS — Z79899 Other long term (current) drug therapy: Secondary | ICD-10-CM | POA: Diagnosis not present

## 2016-11-09 DIAGNOSIS — M5416 Radiculopathy, lumbar region: Secondary | ICD-10-CM | POA: Diagnosis not present

## 2016-11-09 DIAGNOSIS — M961 Postlaminectomy syndrome, not elsewhere classified: Secondary | ICD-10-CM | POA: Diagnosis not present

## 2016-11-09 DIAGNOSIS — Z5181 Encounter for therapeutic drug level monitoring: Secondary | ICD-10-CM | POA: Diagnosis present

## 2016-11-09 DIAGNOSIS — G894 Chronic pain syndrome: Secondary | ICD-10-CM | POA: Diagnosis not present

## 2016-11-09 DIAGNOSIS — M62838 Other muscle spasm: Secondary | ICD-10-CM | POA: Diagnosis not present

## 2016-11-09 MED ORDER — OXYCODONE HCL 10 MG PO TABS: 10.0000 mg | ORAL_TABLET | Freq: Four times a day (QID) | ORAL | 0 refills | Status: DC | PRN

## 2016-11-09 NOTE — Progress Notes (Signed)
Subjective:    Patient ID: Logan Briggs, male    DOB: 01-Aug-1956, 61 y.o.   MRN: [id number]  HPI:  Logan Briggs is a 61 year old male who returns for follow up appointment for chronic pain and medication refill. He states his pain is located in his lower back radiating into his left lower extremity laterally. He rates his pain 7. His current exercise regime is walking and performing stretching exercises.  Pain Inventory Average Pain 7 Pain Right Now 7 My pain is sharp and stabbing  In the last 24 hours, has pain interfered with the following? General activity 5 Relation with others 5 Enjoyment of life 6 What TIME of day is your pain at its worst? Evening, night Sleep (in general) Good  Pain is worse with: some activites Pain improves with: heat/ice and medication Relief from Meds: 7  Mobility ability to climb steps?  yes do you drive?  yes Do you have any goals in this area?  no  Function Do you have any goals in this area?  no  Neuro/Psych No problems in this area  Prior Studies Any changes since last visit?  no  Physicians involved in your care Any changes since last visit?  no   No family history on file. Social History   Social History  . Marital status: Married    Spouse name: N/A  . Number of children: N/A  . Years of education: N/A   Social History Main Topics  . Smoking status: Current Every Day Smoker    Packs/day: 1.00    Years: 20.00    Types: Cigarettes  . Smokeless tobacco: Never Used     Comment: desires to quit but starts again when upset or stressed  . Alcohol use Yes     Comment: only on holidays, beer  . Drug use: No  . Sexual activity: No   Other Topics Concern  . None   Social History Narrative  . None   Past Surgical History:  Procedure Laterality Date  . BACK SURGERY     Past Medical History:  Diagnosis Date  . Chronic back pain   . Glaucoma    BP 129/76   Pulse 64   Resp 14   SpO2 95%   Opioid Risk  Score:   Fall Risk Score:  `1  Depression screen PHQ 2/9  Depression screen [facility] 2/9 07/21/2015 06/23/2015 05/22/2015  Decreased Interest 0 0 0  Down, Depressed, Hopeless 0 0 0  PHQ - 2 Score 0 0 0  Altered sleeping - - 3  Tired, decreased energy - - 0  Change in appetite - - 0  Feeling bad or failure about yourself  - - 3  Trouble concentrating - - 0  Moving slowly or fidgety/restless - - 3  Suicidal thoughts - - 0  PHQ-9 Score - - 9    Review of Systems  Constitutional: Negative.   HENT: Negative.   Eyes: Negative.   Respiratory: Negative.   Cardiovascular: Negative.   Gastrointestinal: Negative.   Endocrine: Negative.   Genitourinary: Negative.   Musculoskeletal: Negative.   Skin: Negative.   Allergic/Immunologic: Negative.   Neurological: Negative.   Hematological: Negative.   Psychiatric/Behavioral: Negative.   All other systems reviewed and are negative.      Objective:   Physical Exam  Constitutional: He is oriented to person, place, and time. He appears well-developed and well-nourished.  HENT:  Head: Normocephalic and atraumatic.  Neck: Normal range  of motion. Neck supple.  Cardiovascular: Normal rate and regular rhythm.   Pulmonary/Chest: Effort normal and breath sounds normal.  Musculoskeletal:  Normal Muscle Bulk and Muscle Testing Reveals: Upper Extremities: Full ROM and Muscle Strength 5/5 Back without Spinal Tenderness Lower Extremities: Full ROM and Muscle Strength 5/5 Arises from Table with ease Narrow Based Gait    Neurological: He is alert and oriented to person, place, and time.  Skin: Skin is warm and dry.  Psychiatric: He has a normal mood and affect.  Nursing note and vitals reviewed.         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  Refilled: Oxycodone 10 mg one tablet 4 times a day as needed for pain #120. We will continue the opioid monitoring program, tis consists of regular clinic visits,  examinations, urine drug screen, pill counts as well as use of [place] Controlled Substance Reporting System.  Continue with exercise regime. 2. Lumbar Radiculopathy: Continue Gabapentin andPamelor 3. Muscle Spasm: Continue Baclofen 4. Insomnia: Continue: Pamelor  20 minutes of face to face patient care time was spent during this visit. All questions were encouraged and answered.   F/U in 1 month

## 2016-12-08 ENCOUNTER — Encounter: Payer: Self-pay | Admitting: Registered Nurse

## 2016-12-08 ENCOUNTER — Encounter (HOSPITAL_BASED_OUTPATIENT_CLINIC_OR_DEPARTMENT_OTHER): Payer: Medicare PPO | Admitting: Registered Nurse

## 2016-12-08 ENCOUNTER — Telehealth: Payer: Self-pay | Admitting: Registered Nurse

## 2016-12-08 VITALS — BP 143/74 | HR 82 | Resp 14

## 2016-12-08 DIAGNOSIS — M5416 Radiculopathy, lumbar region: Secondary | ICD-10-CM | POA: Diagnosis not present

## 2016-12-08 DIAGNOSIS — G894 Chronic pain syndrome: Secondary | ICD-10-CM

## 2016-12-08 DIAGNOSIS — M62838 Other muscle spasm: Secondary | ICD-10-CM | POA: Diagnosis not present

## 2016-12-08 DIAGNOSIS — M961 Postlaminectomy syndrome, not elsewhere classified: Secondary | ICD-10-CM | POA: Diagnosis not present

## 2016-12-08 DIAGNOSIS — Z79899 Other long term (current) drug therapy: Secondary | ICD-10-CM

## 2016-12-08 DIAGNOSIS — Z5181 Encounter for therapeutic drug level monitoring: Secondary | ICD-10-CM

## 2016-12-08 MED ORDER — OXYCODONE HCL 10 MG PO TABS: 10.0000 mg | ORAL_TABLET | Freq: Four times a day (QID) | ORAL | 0 refills | Status: DC | PRN

## 2016-12-08 NOTE — Telephone Encounter (Signed)
On January 31,2018 the  [place] was reviewed no conflict was seen on the [place] with multiple prescribers. Mr. Waas has a signed narcotic contract with our office. If there were any discrepancies this would have been reported to his physician.

## 2016-12-08 NOTE — Progress Notes (Signed)
Subjective:    Patient ID: Logan Briggs, male    DOB: 1956/07/15, 60 y.o.   MRN: [id number]  HPI: [name] is a 61 year old male who returns for follow up appointment for chronic pain and medication refill. He states his pain is located in his lower back radiating into his left lower extremity laterally. He rates his pain 7. His current exercise regime is walking and performing stretching exercises.  Pain Inventory Average Pain 7 Pain Right Now 7 My pain is sharp and stabbing  In the last 24 hours, has pain interfered with the following? General activity 5 Relation with others 5 Enjoyment of life 5 What TIME of day is your pain at its worst? evening, night Sleep (in general) Good  Pain is worse with: some activites Pain improves with: heat/ice and medication Relief from Meds: 7  Mobility ability to climb steps?  yes do you drive?  yes Do you have any goals in this area?  no  Function I need assistance with the following:  household duties Do you have any goals in this area?  no  Neuro/Psych No problems in this area  Prior Studies Any changes since last visit?  no  Physicians involved in your care Any changes since last visit?  no   No family history on file. Social History   Social History  . Marital status: Married    Spouse name: N/A  . Number of children: N/A  . Years of education: N/A   Social History Main Topics  . Smoking status: Current Every Day Smoker    Packs/day: 1.00    Years: 20.00    Types: Cigarettes  . Smokeless tobacco: Never Used     Comment: desires to quit but starts again when upset or stressed  . Alcohol use Yes     Comment: only on holidays, beer  . Drug use: No  . Sexual activity: No   Other Topics Concern  . None   Social History Narrative  . None   Past Surgical History:  Procedure Laterality Date  . BACK SURGERY     Past Medical History:  Diagnosis Date  . Chronic back pain   . Glaucoma    BP (!)  143/74   Pulse 82   Resp 14   SpO2 98%   Opioid Risk Score:   Fall Risk Score:  `1  Depression screen PHQ 2/9  Depression screen [facility] 2/9 07/21/2015 06/23/2015 05/22/2015  Decreased Interest 0 0 0  Down, Depressed, Hopeless 0 0 0  PHQ - 2 Score 0 0 0  Altered sleeping - - 3  Tired, decreased energy - - 0  Change in appetite - - 0  Feeling bad or failure about yourself  - - 3  Trouble concentrating - - 0  Moving slowly or fidgety/restless - - 3  Suicidal thoughts - - 0  PHQ-9 Score - - 9     Review of Systems  Constitutional: Negative.   HENT: Negative.   Eyes: Negative.   Respiratory: Negative.   Cardiovascular: Negative.   Gastrointestinal: Negative.   Endocrine: Negative.   Genitourinary: Negative.   Musculoskeletal: Negative.   Skin: Negative.   Allergic/Immunologic: Negative.   Neurological: Negative.   Hematological: Negative.   Psychiatric/Behavioral: Negative.   All other systems reviewed and are negative.      Objective:   Physical Exam  Constitutional: He is oriented to person, place, and time. He appears well-developed and well-nourished.  HENT:  Head: Normocephalic and atraumatic.  Neck: Normal range of motion. Neck supple.  Cardiovascular: Normal rate and regular rhythm.   Pulmonary/Chest: Effort normal and breath sounds normal.  Musculoskeletal:  Normal Muscle Bulk and Muscle Testing Reveals: Upper Extremities: Full ROM and Muscle Strength 5/5 Back without spinal tenderness noted Lower Extremities: Full ROM and Muscle Strength 5/5 Arises from Table with ease Narrow Based Gait  Neurological: He is alert and oriented to person, place, and time.  Skin: Skin is warm and dry.  Psychiatric: He has a normal mood and affect.  Nursing note and vitals reviewed.         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  Refilled: Oxycodone 10 mg one tablet 4 times a day as needed for pain #120. We will continue the opioid  monitoring program, tis consists of regular clinic visits, examinations, urine drug screen, pill counts as well as use of [place] Controlled Substance Reporting System.  Continue with exercise regime. 2. Lumbar Radiculopathy: Continue Gabapentin andPamelor 3. Muscle Spasm: Continue Baclofen 4. Insomnia: Continue: Pamelor  20 minutes of face to face patient care time was spent during this visit. All questions were encouraged and answered.   F/U in 1 month

## 2017-01-05 ENCOUNTER — Encounter: Payer: Self-pay | Admitting: Registered Nurse

## 2017-01-05 ENCOUNTER — Encounter: Payer: Medicare PPO | Attending: Physical Medicine & Rehabilitation | Admitting: Registered Nurse

## 2017-01-05 VITALS — BP 136/80 | HR 64 | Resp 14

## 2017-01-05 DIAGNOSIS — G894 Chronic pain syndrome: Secondary | ICD-10-CM | POA: Diagnosis not present

## 2017-01-05 DIAGNOSIS — Z5181 Encounter for therapeutic drug level monitoring: Secondary | ICD-10-CM | POA: Diagnosis present

## 2017-01-05 DIAGNOSIS — M5416 Radiculopathy, lumbar region: Secondary | ICD-10-CM | POA: Diagnosis not present

## 2017-01-05 DIAGNOSIS — M961 Postlaminectomy syndrome, not elsewhere classified: Secondary | ICD-10-CM | POA: Diagnosis not present

## 2017-01-05 DIAGNOSIS — Z79899 Other long term (current) drug therapy: Secondary | ICD-10-CM

## 2017-01-05 DIAGNOSIS — M62838 Other muscle spasm: Secondary | ICD-10-CM | POA: Diagnosis not present

## 2017-01-05 MED ORDER — OXYCODONE HCL 10 MG PO TABS: 10.0000 mg | ORAL_TABLET | Freq: Four times a day (QID) | ORAL | 0 refills | Status: DC | PRN

## 2017-01-05 NOTE — Progress Notes (Signed)
Subjective:    Patient ID: Logan Briggs, male    DOB: 13-Jun-1956, 61 y.o.   MRN: [id number]  HPI:  Logan Briggs is a 61 year old male who returns for follow up appointment for chronic pain and medication refill. He states his pain is located in his lower back radiating into his left lower extremity posteriorly. He rates his pain 7. His current exercise regime is walking and performing stretching exercises.  Pain Inventory Average Pain 7 Pain Right Now 7 My pain is sharp and stabbing  In the last 24 hours, has pain interfered with the following? General activity 5 Relation with others 5 Enjoyment of life 6 What TIME of day is your pain at its worst? evening and night Sleep (in general) Good  Pain is worse with: some activites Pain improves with: therapy/exercise and medication Relief from Meds: 7  Mobility how many minutes can you walk? 60 ability to climb steps?  yes do you drive?  yes  Function I need assistance with the following:  household duties  Neuro/Psych No problems in this area  Prior Studies Any changes since last visit?  no  Physicians involved in your care Any changes since last visit?  no   No family history on file. Social History   Social History  . Marital status: Married    Spouse name: N/A  . Number of children: N/A  . Years of education: N/A   Social History Main Topics  . Smoking status: Current Every Day Smoker    Packs/day: 1.00    Years: 20.00    Types: Cigarettes  . Smokeless tobacco: Never Used     Comment: desires to quit but starts again when upset or stressed  . Alcohol use Yes     Comment: only on holidays, beer  . Drug use: No  . Sexual activity: No   Other Topics Concern  . None   Social History Narrative  . None   Past Surgical History:  Procedure Laterality Date  . BACK SURGERY     Past Medical History:  Diagnosis Date  . Chronic back pain   . Glaucoma    BP (!) 166/82   Pulse 64   Resp 14    SpO2 96%   Opioid Risk Score:   Fall Risk Score:  `1  Depression screen PHQ 2/9  Depression screen [facility] 2/9 01/05/2017 07/21/2015 06/23/2015 05/22/2015  Decreased Interest 0 0 0 0  Down, Depressed, Hopeless 0 0 0 0  PHQ - 2 Score 0 0 0 0  Altered sleeping - - - 3  Tired, decreased energy - - - 0  Change in appetite - - - 0  Feeling bad or failure about yourself  - - - 3  Trouble concentrating - - - 0  Moving slowly or fidgety/restless - - - 3  Suicidal thoughts - - - 0  PHQ-9 Score - - - 9   Review of Systems  Constitutional: Negative.   HENT: Negative.   Eyes: Negative.   Respiratory: Negative.   Cardiovascular: Negative.   Gastrointestinal: Negative.   Endocrine: Negative.   Genitourinary: Negative.   Musculoskeletal: Negative.   Skin: Negative.   Neurological: Negative.   Hematological: Negative.   Psychiatric/Behavioral: Negative.   All other systems reviewed and are negative.      Objective:   Physical Exam  Constitutional: He is oriented to person, place, and time. He appears well-developed and well-nourished.  HENT:  Head: Normocephalic and  atraumatic.  Neck: Normal range of motion. Neck supple.  Cardiovascular: Normal rate and regular rhythm.   Pulmonary/Chest: Effort normal and breath sounds normal.  Musculoskeletal:  Normal Muscle Bulk and Muscle Testing Reveals: Upper Extremities: Full ROM and Muscle Strength 5/5 Back without spinal tenderness noted Lower Extremities: Full ROM and Muscle Strength 5/5 Arises from Table with ease Narrow Based Gait  Neurological: He is alert and oriented to person, place, and time.  Skin: Skin is warm and dry.  Psychiatric: He has a normal mood and affect.  Nursing note and vitals reviewed.         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 01/05/2017 Refilled: Oxycodone 10 mg one tablet 4 times a day as needed for pain #120. We will continue the opioid monitoring program, tis  consists of regular clinic visits, examinations, urine drug screen, pill counts as well as use of [place] Controlled Substance Reporting System.  Continue with exercise regime. 2. Lumbar Radiculopathy: Continue Gabapentin andPamelor. 01/05/2017 3. Muscle Spasm: Continue Baclofen. 01/05/2017 4. Insomnia: Continue: Pamelor. 01/05/2017  15 minutes of face to face patient care time was spent during this visit. All questions were encouraged and answered  F/U in 1 month

## 2017-01-31 ENCOUNTER — Encounter: Payer: Self-pay | Admitting: Registered Nurse

## 2017-01-31 ENCOUNTER — Encounter: Payer: Medicare PPO | Attending: Physical Medicine & Rehabilitation | Admitting: Registered Nurse

## 2017-01-31 VITALS — BP 147/80 | HR 88

## 2017-01-31 DIAGNOSIS — Z5181 Encounter for therapeutic drug level monitoring: Secondary | ICD-10-CM | POA: Insufficient documentation

## 2017-01-31 DIAGNOSIS — M961 Postlaminectomy syndrome, not elsewhere classified: Secondary | ICD-10-CM

## 2017-01-31 DIAGNOSIS — M62838 Other muscle spasm: Secondary | ICD-10-CM

## 2017-01-31 DIAGNOSIS — Z79899 Other long term (current) drug therapy: Secondary | ICD-10-CM | POA: Diagnosis not present

## 2017-01-31 DIAGNOSIS — G894 Chronic pain syndrome: Secondary | ICD-10-CM | POA: Insufficient documentation

## 2017-01-31 DIAGNOSIS — M5416 Radiculopathy, lumbar region: Secondary | ICD-10-CM | POA: Diagnosis not present

## 2017-01-31 MED ORDER — OXYCODONE HCL 10 MG PO TABS: 10.0000 mg | ORAL_TABLET | Freq: Four times a day (QID) | ORAL | 0 refills | Status: DC | PRN

## 2017-01-31 NOTE — Progress Notes (Signed)
Subjective:    Patient ID: Logan Briggs, male    DOB: 09/18/1956, 61 y.o.   MRN: [id number]  HPI: Mr. Jaquise Briggs is a 61year old male who returns for follow up appointment for chronic pain and medication refill. He states his pain is located in his lower back radiating into his left lower extremity posteriorly. He rates his pain 7. His current exercise regime is walking and performing stretching exercises.  Pain Inventory Average Pain 7 Pain Right Now 7 My pain is sharp and stabbing  In the last 24 hours, has pain interfered with the following? General activity 5 Relation with others 5 Enjoyment of life 6 What TIME of day is your pain at its worst? evening and night Sleep (in general) Good  Pain is worse with: some activites Pain improves with: heat/ice and medication Relief from Meds: 7  Mobility ability to climb steps?  yes do you drive?  yes Do you have any goals in this area?  no  Function Do you have any goals in this area?  no  Neuro/Psych No problems in this area  Prior Studies Any changes since last visit?  no  Physicians involved in your care Any changes since last visit?  no   No family history on file. Social History   Social History  . Marital status: Married    Spouse name: N/A  . Number of children: N/A  . Years of education: N/A   Social History Main Topics  . Smoking status: Current Every Day Smoker    Packs/day: 1.00    Years: 20.00    Types: Cigarettes  . Smokeless tobacco: Never Used     Comment: desires to quit but starts again when upset or stressed  . Alcohol use Yes     Comment: only on holidays, beer  . Drug use: No  . Sexual activity: No   Other Topics Concern  . None   Social History Narrative  . None   Past Surgical History:  Procedure Laterality Date  . BACK SURGERY     Past Medical History:  Diagnosis Date  . Chronic back pain   . Glaucoma    BP (!) 147/80   Pulse 88   SpO2 95%   Opioid Risk  Score:   Fall Risk Score:  `1  Depression screen PHQ 2/9  Depression screen [facility] 2/9 01/05/2017 07/21/2015 06/23/2015 05/22/2015  Decreased Interest 0 0 0 0  Down, Depressed, Hopeless 0 0 0 0  PHQ - 2 Score 0 0 0 0  Altered sleeping - - - 3  Tired, decreased energy - - - 0  Change in appetite - - - 0  Feeling bad or failure about yourself  - - - 3  Trouble concentrating - - - 0  Moving slowly or fidgety/restless - - - 3  Suicidal thoughts - - - 0  PHQ-9 Score - - - 9    Review of Systems  Constitutional: Negative.   HENT: Negative.   Eyes: Negative.   Respiratory: Negative.   Cardiovascular: Negative.   Gastrointestinal: Negative.   Endocrine: Negative.   Genitourinary: Negative.   Musculoskeletal: Negative.   Skin: Negative.   Allergic/Immunologic: Negative.   Neurological: Negative.   Hematological: Negative.   Psychiatric/Behavioral: Negative.        Objective:   Physical Exam  Constitutional: He is oriented to person, place, and time. He appears well-developed and well-nourished.  HENT:  Head: Normocephalic and atraumatic.  Neck: Normal  range of motion. Neck supple.  Cardiovascular: Normal rate and regular rhythm.   Pulmonary/Chest: Effort normal and breath sounds normal.  Musculoskeletal:  Normal Muscle Bulk and Muscle Testing Reveals: Upper Extremities: Full ROM and Muscle Strength 5/5 Back without spinal tenderness noted Lower Extremities: Full ROM and Muscle Strength 5/5 Arises from Table with ease Narrow Based Gait  Neurological: He is alert and oriented to person, place, and time.  Skin: Skin is warm and dry.  Psychiatric: He has a normal mood and affect.  Nursing note and vitals reviewed.         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 01/31/2017 Refilled: Oxycodone 10 mg one tablet 4 times a day as needed for pain #120. We will continue the opioid monitoring program, tis consists of regular clinic visits,  examinations, urine drug screen, pill counts as well as use of [place] Controlled Substance Reporting System.  Continue with exercise regime. 2. Lumbar Radiculopathy: Continue Gabapentin andPamelor. 01/31/2017 3. Muscle Spasm: Continue Baclofen. 01/31/2017 4. Insomnia: Continue: Pamelor. 01/31/2017  15 minutes of face to face patient care time was spent during this visit. All questions were encouraged and answered.   F/U in 1 month

## 2017-02-04 LAB — TOXASSURE SELECT,+ANTIDEPR,UR

## 2017-02-18 ENCOUNTER — Telehealth: Payer: Self-pay | Admitting: Registered Nurse

## 2017-02-18 NOTE — Telephone Encounter (Signed)
Mr. Fillinger had a UDS performed on 01/31/2017, it was consistent.

## 2017-02-28 ENCOUNTER — Ambulatory Visit: Payer: Medicare PPO | Admitting: Registered Nurse

## 2017-03-01 ENCOUNTER — Encounter: Payer: Medicare PPO | Attending: Physical Medicine & Rehabilitation | Admitting: Registered Nurse

## 2017-03-01 ENCOUNTER — Encounter: Payer: Self-pay | Admitting: Registered Nurse

## 2017-03-01 DIAGNOSIS — M5416 Radiculopathy, lumbar region: Secondary | ICD-10-CM | POA: Diagnosis not present

## 2017-03-01 DIAGNOSIS — G894 Chronic pain syndrome: Secondary | ICD-10-CM

## 2017-03-01 DIAGNOSIS — Z79899 Other long term (current) drug therapy: Secondary | ICD-10-CM

## 2017-03-01 DIAGNOSIS — M961 Postlaminectomy syndrome, not elsewhere classified: Secondary | ICD-10-CM

## 2017-03-01 DIAGNOSIS — M62838 Other muscle spasm: Secondary | ICD-10-CM | POA: Diagnosis not present

## 2017-03-01 DIAGNOSIS — Z5181 Encounter for therapeutic drug level monitoring: Secondary | ICD-10-CM

## 2017-03-01 MED ORDER — OXYCODONE HCL 10 MG PO TABS: 10.0000 mg | ORAL_TABLET | Freq: Four times a day (QID) | ORAL | 0 refills | Status: DC | PRN

## 2017-03-01 NOTE — Progress Notes (Signed)
Subjective:    Patient ID: Logan Briggs, male    DOB: January 16, 1956, 61 y.o.   MRN: [id number]  HPI: Mr. Koleton Duchemin is a 61year old male who returns for follow up appointment for chronic pain and medication refill. He states his pain is located in his lower back radiating into his left lower extremity posteriorly. He rates his pain 7. His current exercise regime is walking and performing stretching exercises.  His last UDS was on 01/31/2017, it was consistent.   Pain Inventory Average Pain 7 Pain Right Now 7 My pain is sharp and stabbing  In the last 24 hours, has pain interfered with the following? General activity 5 Relation with others 5 Enjoyment of life 6 What TIME of day is your pain at its worst? evening and night Sleep (in general) Good  Pain is worse with: some activites Pain improves with: heat/ice and medication Relief from Meds: 7  Mobility how many minutes can you walk? 60 ability to climb steps?  yes do you drive?  yes  Function Do you have any goals in this area?  no  Neuro/Psych No problems in this area  Prior Studies Any changes since last visit?  no  Physicians involved in your care Any changes since last visit?  no   No family history on file. Social History   Social History  . Marital status: Married    Spouse name: N/A  . Number of children: N/A  . Years of education: N/A   Social History Main Topics  . Smoking status: Current Every Day Smoker    Packs/day: 1.00    Years: 20.00    Types: Cigarettes  . Smokeless tobacco: Never Used     Comment: desires to quit but starts again when upset or stressed  . Alcohol use Yes     Comment: only on holidays, beer  . Drug use: No  . Sexual activity: No   Other Topics Concern  . Not on file   Social History Narrative  . No narrative on file   Past Surgical History:  Procedure Laterality Date  . BACK SURGERY     Past Medical History:  Diagnosis Date  . Chronic back pain   .  Glaucoma    There were no vitals taken for this visit.  Opioid Risk Score:   Fall Risk Score:  `1  Depression screen PHQ 2/9  Depression screen [facility] 2/9 03/01/2017 01/05/2017 07/21/2015 06/23/2015 05/22/2015  Decreased Interest 0 0 0 0 0  Down, Depressed, Hopeless 0 0 0 0 0  PHQ - 2 Score 0 0 0 0 0  Altered sleeping - - - - 3  Tired, decreased energy - - - - 0  Change in appetite - - - - 0  Feeling bad or failure about yourself  - - - - 3  Trouble concentrating - - - - 0  Moving slowly or fidgety/restless - - - - 3  Suicidal thoughts - - - - 0  PHQ-9 Score - - - - 9   Review of Systems  Constitutional: Negative.   HENT: Negative.   Eyes: Negative.   Respiratory: Negative.   Cardiovascular: Negative.   Gastrointestinal: Negative.   Endocrine: Negative.   Genitourinary: Negative.   Musculoskeletal: Negative.   Skin: Negative.   Allergic/Immunologic: Negative.   Neurological: Negative.   Hematological: Negative.   Psychiatric/Behavioral: Negative.   All other systems reviewed and are negative.      Objective:  Physical Exam  Constitutional: He is oriented to person, place, and time. He appears well-developed and well-nourished.  HENT:  Head: Normocephalic and atraumatic.  Neck: Normal range of motion. Neck supple.  Cardiovascular: Normal rate and regular rhythm.   Pulmonary/Chest: Effort normal and breath sounds normal.  Musculoskeletal:  Normal Muscle Bulk and Muscle Testing Reveals: Upper Extremities: Full ROM and Muscle Strength 5/5 Back without spinal tenderness noted Lower Extremities: Full ROM and Muscle Strength 5/5 Arises from Table with ease Narrow Based Gait  Neurological: He is alert and oriented to person, place, and time.  Skin: Skin is warm and dry.  Psychiatric: He has a normal mood and affect.  Nursing note and vitals reviewed.         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 03/01/2017 Refilled: Oxycodone  10 mg one tablet 4 times a day as needed for pain #120. We will continue the opioid monitoring program, tis consists of regular clinic visits, examinations, urine drug screen, pill counts as well as use of [place] Controlled Substance Reporting System.  Continue with exercise regime. 2. Lumbar Radiculopathy: Continue Gabapentin andPamelor. 03/01/2017 3. Muscle Spasm: Continue Baclofen. 03/01/2017 4. Insomnia: Continue: Pamelor. 03/01/2017  20  minutes of face to face patient care time was spent during this visit. All questions were encouraged and answered.  F/U in 1 month

## 2017-03-02 ENCOUNTER — Telehealth: Payer: Self-pay | Admitting: Registered Nurse

## 2017-03-02 NOTE — Telephone Encounter (Signed)
On 03/02/2017 the  [place] was reviewed no conflict was seen on the [place] with multiple prescribers. Logan Briggs has a signed narcotic contract with our office. If there were any discrepancies this would have been reported to his physician.

## 2017-03-29 ENCOUNTER — Encounter: Payer: Self-pay | Admitting: Registered Nurse

## 2017-03-29 ENCOUNTER — Encounter: Payer: Medicare PPO | Attending: Physical Medicine & Rehabilitation | Admitting: Registered Nurse

## 2017-03-29 VITALS — BP 158/78 | HR 68

## 2017-03-29 DIAGNOSIS — M961 Postlaminectomy syndrome, not elsewhere classified: Secondary | ICD-10-CM

## 2017-03-29 DIAGNOSIS — M62838 Other muscle spasm: Secondary | ICD-10-CM | POA: Diagnosis not present

## 2017-03-29 DIAGNOSIS — M5416 Radiculopathy, lumbar region: Secondary | ICD-10-CM | POA: Diagnosis not present

## 2017-03-29 DIAGNOSIS — Z5181 Encounter for therapeutic drug level monitoring: Secondary | ICD-10-CM | POA: Insufficient documentation

## 2017-03-29 DIAGNOSIS — Z79899 Other long term (current) drug therapy: Secondary | ICD-10-CM | POA: Diagnosis not present

## 2017-03-29 DIAGNOSIS — G894 Chronic pain syndrome: Secondary | ICD-10-CM | POA: Insufficient documentation

## 2017-03-29 MED ORDER — OXYCODONE HCL 10 MG PO TABS: 10.0000 mg | ORAL_TABLET | Freq: Four times a day (QID) | ORAL | 0 refills | Status: DC | PRN

## 2017-03-29 NOTE — Progress Notes (Signed)
Subjective:    Patient ID: Logan Briggs, male    DOB: [birth year], 61 y.o.   MRN: [id number]  HPI: Logan Briggs is a 61year old male who returns for follow up appointment for chronic pain and medication refill. He states his pain is located in his lower back radiating into his left lower extremity posteriorly. He rates his pain 7. His current exercise regime is walking, light yardwork and performing stretching exercises.  His last UDS was on 01/31/2017, it was consistent.  Pain Inventory Average Pain 7 Pain Right Now 7 My pain is sharp and stabbing  In the last 24 hours, has pain interfered with the following? General activity 5 Relation with others 5 Enjoyment of life 6 What TIME of day is your pain at its worst? evening and night Sleep (in general) Good  Pain is worse with: some activites Pain improves with: heat/ice Relief from Meds: 7  Mobility ability to climb steps?  yes do you drive?  yes  Function Do you have any goals in this area?  no  Neuro/Psych No problems in this area  Prior Studies Any changes since last visit?  no  Physicians involved in your care Any changes since last visit?  no   No family history on file. Social History   Social History  . Marital status: Married    Spouse name: N/A  . Number of children: N/A  . Years of education: N/A   Social History Main Topics  . Smoking status: Current Every Day Smoker    Packs/day: 1.00    Years: 20.00    Types: Cigarettes  . Smokeless tobacco: Never Used     Comment: desires to quit but starts again when upset or stressed  . Alcohol use Yes     Comment: only on holidays, beer  . Drug use: No  . Sexual activity: No   Other Topics Concern  . None   Social History Narrative  . None   Past Surgical History:  Procedure Laterality Date  . BACK SURGERY     Past Medical History:  Diagnosis Date  . Chronic back pain   . Glaucoma    BP (!) 158/78   Pulse 68   SpO2 95%    Opioid Risk Score:  4 Fall Risk Score:  `1  Depression screen PHQ 2/9  Depression screen [facility] 2/9 03/29/2017 03/01/2017 01/05/2017 07/21/2015 06/23/2015 05/22/2015  Decreased Interest 0 0 0 0 0 0  Down, Depressed, Hopeless 0 0 0 0 0 0  PHQ - 2 Score 0 0 0 0 0 0  Altered sleeping - - - - - 3  Tired, decreased energy - - - - - 0  Change in appetite - - - - - 0  Feeling bad or failure about yourself  - - - - - 3  Trouble concentrating - - - - - 0  Moving slowly or fidgety/restless - - - - - 3  Suicidal thoughts - - - - - 0  PHQ-9 Score - - - - - 9    Review of Systems  Constitutional: Negative.   HENT: Negative.   Eyes: Negative.   Respiratory: Negative.   Cardiovascular: Negative.   Gastrointestinal: Negative.   Endocrine: Negative.   Genitourinary: Negative.   Musculoskeletal: Negative.   Skin: Negative.   Neurological: Negative.   Hematological: Negative.   Psychiatric/Behavioral: Negative.   All other systems reviewed and are negative.      Objective:  Physical Exam  Constitutional: He is oriented to person, place, and time. He appears well-developed and well-nourished.  HENT:  Head: Normocephalic and atraumatic.  Neck: Normal range of motion. Neck supple.  Cardiovascular: Normal rate and regular rhythm.   Pulmonary/Chest: Effort normal and breath sounds normal.  Musculoskeletal:  Normal Muscle Bulk and Muscle Testing Reveals: Upper Extremities: Full ROM and Muscle Strength 5/5 Back without spinal tenderness noted Lower Extremities: Full ROM and Muscle Strength 5/5 Arises from chair with ease Narrow Based Gait  Neurological: He is alert and oriented to person, place, and time.  Skin: Skin is warm and dry.  Psychiatric: He has a normal mood and affect.  Nursing note and vitals reviewed.         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 03/29/2017 Refilled: Oxycodone 10 mg one tablet 4 times a day as needed for pain  #120. We will continue the opioid monitoring program, tis consists of regular clinic visits, examinations, urine drug screen, pill counts as well as use of [place] Controlled Substance Reporting System.  Continue with exercise regime. 2. Lumbar Radiculopathy: Continue Gabapentin andPamelor. 03/29/2017 3. Muscle Spasm: Continue Baclofen. 03/29/2017 4. Insomnia: Continue: Pamelor. 03/29/2017  20 minutes of face to face patient care time was spent during this visit. All questions were encouraged and answered.   F/U in 1 month

## 2017-04-27 ENCOUNTER — Ambulatory Visit: Payer: Medicare PPO | Admitting: Physical Medicine & Rehabilitation

## 2017-04-29 ENCOUNTER — Encounter: Payer: Self-pay | Admitting: Physical Medicine & Rehabilitation

## 2017-04-29 ENCOUNTER — Encounter: Payer: Medicare PPO | Attending: Physical Medicine & Rehabilitation

## 2017-04-29 ENCOUNTER — Ambulatory Visit (HOSPITAL_BASED_OUTPATIENT_CLINIC_OR_DEPARTMENT_OTHER): Payer: Medicare PPO | Admitting: Physical Medicine & Rehabilitation

## 2017-04-29 VITALS — BP 136/76 | HR 75

## 2017-04-29 DIAGNOSIS — G894 Chronic pain syndrome: Secondary | ICD-10-CM

## 2017-04-29 DIAGNOSIS — M5416 Radiculopathy, lumbar region: Secondary | ICD-10-CM

## 2017-04-29 DIAGNOSIS — Z79899 Other long term (current) drug therapy: Secondary | ICD-10-CM | POA: Insufficient documentation

## 2017-04-29 DIAGNOSIS — Z5181 Encounter for therapeutic drug level monitoring: Secondary | ICD-10-CM

## 2017-04-29 DIAGNOSIS — M961 Postlaminectomy syndrome, not elsewhere classified: Secondary | ICD-10-CM

## 2017-04-29 MED ORDER — OXYCODONE HCL 10 MG PO TABS: 10.0000 mg | ORAL_TABLET | Freq: Four times a day (QID) | ORAL | 0 refills | Status: DC | PRN

## 2017-04-29 NOTE — Progress Notes (Signed)
Subjective:    Patient ID: Logan Briggs, male    DOB: July 27, 1956, 61 y.o.   MRN: [id number]  HPI Chief complaint is low back pain as well as left lower extremity pain, which are chronic in nature.  No new changes to his pain pattern. Interval history negative for other medical issues Occ Baclofen for spasms as needed Uses Nortriptyline on occasion for sleep Oxycodone 10mg  QID on regular basis Gabapentin on occ  Oral swab collected today  Uses miralax for constipation or eats raisins about once a week for constipation  Pain Inventory Average Pain 7 Pain Right Now 7 My pain is sharp and stabbing  In the last 24 hours, has pain interfered with the following? General activity 5 Relation with others 5 Enjoyment of life 6 What TIME of day is your pain at its worst? evening and night Sleep (in general) NA  Pain is worse with: some activites Pain improves with: heat/ice and medication Relief from Meds: 7  Mobility ability to climb steps?  yes do you drive?  yes  Function Do you have any goals in this area?  no  Neuro/Psych No problems in this area  Prior Studies Any changes since last visit?  no  Physicians involved in your care Any changes since last visit?  no   No family history on file. Social History   Social History  . Marital status: Married    Spouse name: N/A  . Number of children: N/A  . Years of education: N/A   Social History Main Topics  . Smoking status: Current Every Day Smoker    Packs/day: 1.00    Years: 20.00    Types: Cigarettes  . Smokeless tobacco: Never Used     Comment: desires to quit but starts again when upset or stressed  . Alcohol use Yes     Comment: only on holidays, beer  . Drug use: No  . Sexual activity: No   Other Topics Concern  . None   Social History Narrative  . None   Past Surgical History:  Procedure Laterality Date  . BACK SURGERY     Past Medical History:  Diagnosis Date  . Chronic back pain   .  Glaucoma    BP 136/76   Pulse 75   SpO2 94%   Opioid Risk Score:  4 Fall Risk Score:  `1  Depression screen PHQ 2/9  Depression screen [facility] 2/9 03/29/2017 03/01/2017 01/05/2017 07/21/2015 06/23/2015 05/22/2015  Decreased Interest 0 0 0 0 0 0  Down, Depressed, Hopeless 0 0 0 0 0 0  PHQ - 2 Score 0 0 0 0 0 0  Altered sleeping - - - - - 3  Tired, decreased energy - - - - - 0  Change in appetite - - - - - 0  Feeling bad or failure about yourself  - - - - - 3  Trouble concentrating - - - - - 0  Moving slowly or fidgety/restless - - - - - 3  Suicidal thoughts - - - - - 0  PHQ-9 Score - - - - - 9    Review of Systems  Constitutional: Negative.   HENT: Negative.   Eyes: Negative.   Respiratory: Negative.   Cardiovascular: Negative.   Gastrointestinal: Negative.   Endocrine: Negative.   Genitourinary: Negative.   Musculoskeletal: Positive for back pain.  Skin: Negative.   Allergic/Immunologic: Negative.   Neurological: Negative.   Hematological: Negative.   Psychiatric/Behavioral: Negative.  All other systems reviewed and are negative.      Objective:   Physical Exam  Constitutional: He is oriented to person, place, and time. He appears well-developed and well-nourished.  HENT:  Head: Normocephalic and atraumatic.  Eyes: Conjunctivae and EOM are normal. Pupils are equal, round, and reactive to light.  Neck: Normal range of motion.  Neurological: He is alert and oriented to person, place, and time. No sensory deficit. Gait normal.  Motor strength is 5/5 bilateral hip flexor, knee extensor, dorsiflexor, plantar flexor.   Psychiatric: He has a normal mood and affect.  Nursing note and vitals reviewed.         Assessment & Plan:  1.  Laminectomy and fusion 2008 Chronic pain , Chronic low back. 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 This  consists of regular clinic visits, examinations, urine drug screen, pill counts as well as use of [place] controlled substance reporting System. 2.  Chronic left L4 radiculitis . Patient does not need daily medications for this. He uses [place], as well as nortriptyline on a when necessary basis.

## 2017-04-29 NOTE — Patient Instructions (Signed)
Please walk on regular basis with goal of half hour per day

## 2017-05-04 LAB — DRUG TOX MONITOR 1 W/CONF, ORAL FLD
AMPHETAMINES: NEGATIVE ng/mL (ref <10)
Barbiturates: NEGATIVE ng/mL (ref <10)
Benzodiazepines: NEGATIVE ng/mL (ref <0.50)
Buprenorphine: NEGATIVE ng/mL (ref <0.025)
CODEINE: NEGATIVE ng/mL (ref <2.5)
Cocaine: NEGATIVE ng/mL (ref <2.5)
Cotinine: 97.8 ng/mL — ABNORMAL HIGH (ref <5.0)
DIHYDROCODEINE: NEGATIVE ng/mL (ref <2.5)
FENTANYL: NEGATIVE ng/mL (ref <0.10)
HEROIN METABOLITE: NEGATIVE ng/mL (ref <1.0)
HYDROCODONE: NEGATIVE ng/mL (ref <2.5)
HYDROMORPHONE: NEGATIVE ng/mL (ref <2.5)
MDMA: NEGATIVE ng/mL (ref <10)
MEPERIDINE: NEGATIVE ng/mL (ref <5.0)
MEPROBAMATE: NEGATIVE ng/mL (ref <2.5)
Marijuana: NEGATIVE ng/mL (ref <2.5)
Methadone: NEGATIVE ng/mL (ref <5.0)
Morphine: NEGATIVE ng/mL (ref <2.5)
Nicotine Metabolite: POSITIVE ng/mL — AB (ref <5.0)
Norhydrocodone: NEGATIVE ng/mL (ref <2.5)
Noroxycodone: NEGATIVE ng/mL (ref <2.5)
OPIATES: POSITIVE ng/mL — AB (ref <2.5)
Oxycodone: 16.8 ng/mL — ABNORMAL HIGH (ref <2.5)
Oxymorphone: NEGATIVE ng/mL (ref <2.5)
PHENCYCLIDINE: NEGATIVE ng/mL (ref <10)
PROPOXYPHENE: NEGATIVE ng/mL (ref <5.0)
Tapentadol: NEGATIVE ng/mL (ref <5.0)
Tramadol: NEGATIVE ng/mL (ref <5.0)
Zolpidem: NEGATIVE ng/mL (ref <5.0)

## 2017-05-04 LAB — DRUG TOX ALC METAB W/CON, ORAL FLD: Alcohol Metabolite: NEGATIVE ng/mL (ref <25)

## 2017-05-05 ENCOUNTER — Telehealth: Payer: Self-pay | Admitting: *Deleted

## 2017-05-05 NOTE — Telephone Encounter (Signed)
Oral swab drug screen for this encounter is consistent for prescribed medication. 

## 2017-05-27 ENCOUNTER — Encounter: Payer: Medicare PPO | Attending: Physical Medicine & Rehabilitation | Admitting: Registered Nurse

## 2017-05-27 ENCOUNTER — Telehealth: Payer: Self-pay | Admitting: Registered Nurse

## 2017-05-27 ENCOUNTER — Encounter: Payer: Self-pay | Admitting: Registered Nurse

## 2017-05-27 VITALS — BP 127/75 | HR 76

## 2017-05-27 DIAGNOSIS — G894 Chronic pain syndrome: Secondary | ICD-10-CM | POA: Diagnosis present

## 2017-05-27 DIAGNOSIS — Z5181 Encounter for therapeutic drug level monitoring: Secondary | ICD-10-CM | POA: Diagnosis present

## 2017-05-27 DIAGNOSIS — M961 Postlaminectomy syndrome, not elsewhere classified: Secondary | ICD-10-CM | POA: Diagnosis not present

## 2017-05-27 DIAGNOSIS — M62838 Other muscle spasm: Secondary | ICD-10-CM | POA: Diagnosis not present

## 2017-05-27 DIAGNOSIS — M5416 Radiculopathy, lumbar region: Secondary | ICD-10-CM

## 2017-05-27 DIAGNOSIS — Z79899 Other long term (current) drug therapy: Secondary | ICD-10-CM | POA: Insufficient documentation

## 2017-05-27 MED ORDER — OXYCODONE HCL 10 MG PO TABS: 10.0000 mg | ORAL_TABLET | Freq: Four times a day (QID) | ORAL | 0 refills | Status: DC | PRN

## 2017-05-27 NOTE — Telephone Encounter (Signed)
On 05/27/2017 the  [place] was reviewed no conflict was seen on the [place] with multiple prescribers. [name] has a signed narcotic contract with our office. If there were any discrepancies this would have been reported to his physician.

## 2017-05-27 NOTE — Progress Notes (Signed)
Subjective:    Patient ID: Logan Briggs, male    DOB: Oct 19, 1956, 61 y.o.   MRN: [id number]  HPI: [name] is a 61year old male who returns for follow up appointment for chronic pain and medication refill. He states his pain is located in his lower back radiating into his left hip and left lower extremity. He rates his pain 7. His current exercise regime is walking, light yardwork and performing stretching exercises.  Logan Briggs went to [facility] ED on 05/05/2017 for Left Upper Quadrant PainDX: Acute Cystitis with Hematuria, Bactrim was ordered  His Oral Swab was on 04/29/2017, it was consistent.   Pain Inventory Average Pain 7 Pain Right Now 7 My pain is sharp and stabbing  In the last 24 hours, has pain interfered with the following? General activity 5 Relation with others 5 Enjoyment of life 6 What TIME of day is your pain at its worst? eveing and night Sleep (in general) NA  Pain is worse with: some activites Pain improves with: heat/ice and medication Relief from Meds: 7  Mobility ability to climb steps?  yes do you drive?  yes  Function Do you have any goals in this area?  no  Neuro/Psych No problems in this area  Prior Studies Any changes since last visit?  no  Physicians involved in your care Any changes since last visit?  no   No family history on file. Social History   Social History  . Marital status: Married    Spouse name: N/A  . Number of children: N/A  . Years of education: N/A   Social History Main Topics  . Smoking status: Current Every Day Smoker    Packs/day: 1.00    Years: 20.00    Types: Cigarettes  . Smokeless tobacco: Never Used     Comment: desires to quit but starts again when upset or stressed  . Alcohol use Yes     Comment: only on holidays, beer  . Drug use: No  . Sexual activity: No   Other Topics Concern  . Not on file   Social History Narrative  . No narrative on file   Past Surgical  History:  Procedure Laterality Date  . BACK SURGERY     Past Medical History:  Diagnosis Date  . Chronic back pain   . Glaucoma    There were no vitals taken for this visit.  Opioid Risk Score:  4 Fall Risk Score:  `1  Depression screen PHQ 2/9  Depression screen [facility] 2/9 05/27/2017 03/29/2017 03/01/2017 01/05/2017 07/21/2015 06/23/2015 05/22/2015  Decreased Interest 0 0 0 0 0 0 0  Down, Depressed, Hopeless 0 0 0 0 0 0 0  PHQ - 2 Score 0 0 0 0 0 0 0  Altered sleeping - - - - - - 3  Tired, decreased energy - - - - - - 0  Change in appetite - - - - - - 0  Feeling bad or failure about yourself  - - - - - - 3  Trouble concentrating - - - - - - 0  Moving slowly or fidgety/restless - - - - - - 3  Suicidal thoughts - - - - - - 0  PHQ-9 Score - - - - - - 9   Review of Systems  Constitutional: Negative.   HENT: Negative.   Eyes: Negative.   Respiratory: Negative.   Cardiovascular: Negative.   Gastrointestinal: Negative.   Endocrine: Negative.  Genitourinary: Negative.   Musculoskeletal: Negative.   Skin: Negative.   Allergic/Immunologic: Negative.   Neurological: Negative.   Hematological: Negative.   Psychiatric/Behavioral: Negative.   All other systems reviewed and are negative.      Objective:   Physical Exam  Constitutional: He is oriented to person, place, and time. He appears well-developed and well-nourished.  HENT:  Head: Normocephalic and atraumatic.  Neck: Normal range of motion. Neck supple.  Cardiovascular: Normal rate and regular rhythm.   Pulmonary/Chest: Effort normal and breath sounds normal.  Musculoskeletal:  Normal Muscle Bulk and Muscle Testing Reveals: Upper Extremities: Full ROM and Muscle Strength 5/5 Back without spinal tenderness noted Lower Extremities: Full ROM and Muscle Strength 5/5 Arises from Table with ease Narrow Based gait   Neurological: He is alert and oriented to person, place, and time.  Skin: Skin is warm and dry.  Psychiatric:  He has a normal mood and affect.  Nursing note and vitals reviewed.         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 05/27/2017 Refilled: Oxycodone 10 mg one tablet 4 times a day as needed for pain #120. We will continue the opioid monitoring program, tis consists of regular clinic visits, examinations, urine drug screen, pill counts as well as use of [place] Controlled Substance Reporting System.  Continue with exercise regime. 2. Lumbar Radiculopathy: Continue Gabapentin andPamelor. 05/27/2017 3. Muscle Spasm: Continue Baclofen. 05/27/2017 4. Insomnia: Continue: Pamelor. 05/27/2017  20 minutes of face to face patient care time was spent during this visit. All questions were encouraged and answered.     F/U in 1 month

## 2017-06-28 ENCOUNTER — Encounter: Payer: Medicare PPO | Attending: Physical Medicine & Rehabilitation | Admitting: Registered Nurse

## 2017-06-28 ENCOUNTER — Encounter: Payer: Self-pay | Admitting: Registered Nurse

## 2017-06-28 VITALS — BP 138/74 | HR 65

## 2017-06-28 DIAGNOSIS — M62838 Other muscle spasm: Secondary | ICD-10-CM

## 2017-06-28 DIAGNOSIS — M961 Postlaminectomy syndrome, not elsewhere classified: Secondary | ICD-10-CM

## 2017-06-28 DIAGNOSIS — G894 Chronic pain syndrome: Secondary | ICD-10-CM | POA: Diagnosis not present

## 2017-06-28 DIAGNOSIS — Z79899 Other long term (current) drug therapy: Secondary | ICD-10-CM | POA: Insufficient documentation

## 2017-06-28 DIAGNOSIS — M5416 Radiculopathy, lumbar region: Secondary | ICD-10-CM

## 2017-06-28 DIAGNOSIS — Z5181 Encounter for therapeutic drug level monitoring: Secondary | ICD-10-CM | POA: Diagnosis not present

## 2017-06-28 MED ORDER — OXYCODONE HCL 10 MG PO TABS: 10.0000 mg | ORAL_TABLET | Freq: Four times a day (QID) | ORAL | 0 refills | Status: DC | PRN

## 2017-06-28 NOTE — Progress Notes (Signed)
Subjective:    Patient ID: Logan Briggs, male    DOB: [birth year], 61 y.o.   MRN: [id number]  HPI: [name] is a 61year old male who returns for follow up appointment for chronic pain and medication refill. He states his pain is located in his lower back radiating into his left lower extremity. He rates his pain 7. His current exercise regime is walking, light yardworkand performing stretching exercises.  His Oral Swab was on 04/29/2017, it was consistent. Oral Swab Performed Today.   Pain Inventory Average Pain 7 Pain Right Now 7 My pain is sharp and stabbing  In the last 24 hours, has pain interfered with the following? General activity 5 Relation with others 5 Enjoyment of life 6 What TIME of day is your pain at its worst? evening Sleep (in general) Fair  Pain is worse with: some activites Pain improves with: heat/ice and medication Relief from Meds: 7  Mobility walk without assistance ability to climb steps?  yes do you drive?  yes  Function Do you have any goals in this area?  no  Neuro/Psych No problems in this area  Prior Studies Any changes since last visit?  no  Physicians involved in your care Any changes since last visit?  no   No family history on file. Social History   Social History  . Marital status: Married    Spouse name: N/A  . Number of children: N/A  . Years of education: N/A   Social History Main Topics  . Smoking status: Current Every Day Smoker    Packs/day: 1.00    Years: 20.00    Types: Cigarettes  . Smokeless tobacco: Never Used     Comment: desires to quit but starts again when upset or stressed  . Alcohol use Yes     Comment: only on holidays, beer  . Drug use: No  . Sexual activity: No   Other Topics Concern  . Not on file   Social History Narrative  . No narrative on file   Past Surgical History:  Procedure Laterality Date  . BACK SURGERY     Past Medical History:  Diagnosis Date  . Chronic  back pain   . Glaucoma    There were no vitals taken for this visit.  Opioid Risk Score:   Fall Risk Score:  `1  Depression screen PHQ 2/9  Depression screen [facility] 2/9 05/27/2017 03/29/2017 03/01/2017 01/05/2017 07/21/2015 06/23/2015 05/22/2015  Decreased Interest 0 0 0 0 0 0 0  Down, Depressed, Hopeless 0 0 0 0 0 0 0  PHQ - 2 Score 0 0 0 0 0 0 0  Altered sleeping - - - - - - 3  Tired, decreased energy - - - - - - 0  Change in appetite - - - - - - 0  Feeling bad or failure about yourself  - - - - - - 3  Trouble concentrating - - - - - - 0  Moving slowly or fidgety/restless - - - - - - 3  Suicidal thoughts - - - - - - 0  PHQ-9 Score - - - - - - 9    Review of Systems  Constitutional: Negative.   HENT: Negative.   Eyes: Negative.   Respiratory: Negative.   Cardiovascular: Negative.   Gastrointestinal: Negative.   Endocrine: Negative.   Genitourinary: Negative.   Musculoskeletal: Negative.   Skin: Negative.   Allergic/Immunologic: Negative.   Neurological: Negative.  Hematological: Negative.   Psychiatric/Behavioral: Negative.   All other systems reviewed and are negative.      Objective:   Physical Exam  Constitutional: He is oriented to person, place, and time. He appears well-developed and well-nourished.  HENT:  Head: Normocephalic and atraumatic.  Neck: Normal range of motion. Neck supple.  Cardiovascular: Normal rate and regular rhythm.   Pulmonary/Chest: Effort normal and breath sounds normal.  Musculoskeletal:  Normal Muscle Bulk and Muscle Testing Reveals: Upper Extremities: Full ROM and Muscle Strength 5/5 Back without spinal Tenderness Noted Lower Extremities: Full ROM and Muscle Strength 5/5 Arises from table with Ease Narrow Based Gait  Neurological: He is alert and oriented to person, place, and time.  Skin: Skin is warm and dry.  Psychiatric: He has a normal mood and affect.  Nursing note and vitals reviewed.         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 06/28/2017 Refilled: Oxycodone 10 mg one tablet 4 times a day as needed for pain #120. We will continue the opioid monitoring program, tis consists of regular clinic visits, examinations, urine drug screen, pill counts as well as use of [place] Controlled Substance Reporting System.  Continue with exercise regime. 2. Lumbar Radiculopathy: Continue to monitor, [name] reports he has discontinued taking his Gabapentin and Pamelor  > than 30 days. He stated he had daytime drowsiness and no relief with medication. 06/28/2017 3. Muscle Spasm: Continue Baclofen. 06/28/2017  20 minutes of face to face patient care time was spent during this visit. All questions were encouraged and answered.  F/U in 1 month

## 2017-07-06 LAB — DRUG TOX MONITOR 1 W/CONF, ORAL FLD
Amphetamines: NEGATIVE ng/mL (ref <10)
Barbiturates: NEGATIVE ng/mL (ref <10)
Benzodiazepines: NEGATIVE ng/mL (ref <0.50)
Buprenorphine: NEGATIVE ng/mL (ref <0.025)
CODEINE: NEGATIVE ng/mL (ref <2.5)
Cocaine: NEGATIVE ng/mL (ref <2.5)
Cotinine: 135.5 ng/mL — ABNORMAL HIGH (ref <5.0)
Dihydrocodeine: NEGATIVE ng/mL (ref <2.5)
Fentanyl: NEGATIVE ng/mL (ref <0.10)
HYDROMORPHONE: NEGATIVE ng/mL (ref <2.5)
Heroin Metabolite: NEGATIVE ng/mL (ref <1.0)
Hydrocodone: NEGATIVE ng/mL (ref <2.5)
MDMA: NEGATIVE ng/mL (ref <10)
MEPERIDINE: NEGATIVE ng/mL (ref <5.0)
Marijuana: NEGATIVE ng/mL (ref <2.5)
Meprobamate: NEGATIVE ng/mL (ref <2.5)
Methadone: NEGATIVE ng/mL (ref <5.0)
Morphine: NEGATIVE ng/mL (ref <2.5)
NICOTINE METABOLITE: POSITIVE ng/mL — AB (ref <5.0)
NOROXYCODONE: NEGATIVE ng/mL (ref <2.5)
Norhydrocodone: NEGATIVE ng/mL (ref <2.5)
OPIATES: POSITIVE ng/mL — AB (ref <2.5)
OXYCODONE: 13.6 ng/mL — AB (ref <2.5)
Oxymorphone: NEGATIVE ng/mL (ref <2.5)
PHENCYCLIDINE: NEGATIVE ng/mL (ref <10)
Propoxyphene: NEGATIVE ng/mL (ref <5.0)
TAPENTADOL: NEGATIVE ng/mL (ref <5.0)
TRAMADOL: NEGATIVE ng/mL (ref <5.0)
ZOLPIDEM: NEGATIVE ng/mL (ref <5.0)

## 2017-07-06 LAB — DRUG TOX ALC METAB W/CON, ORAL FLD: Alcohol Metabolite: NEGATIVE ng/mL (ref <25)

## 2017-07-12 ENCOUNTER — Telehealth: Payer: Self-pay | Admitting: *Deleted

## 2017-07-12 NOTE — Telephone Encounter (Signed)
Oral swab drug screen was consistent for prescribed medications.  ?

## 2017-07-28 ENCOUNTER — Encounter: Payer: Self-pay | Admitting: Registered Nurse

## 2017-07-28 ENCOUNTER — Encounter: Payer: Medicare PPO | Attending: Physical Medicine & Rehabilitation | Admitting: Registered Nurse

## 2017-07-28 VITALS — BP 114/76 | HR 89 | Resp 14

## 2017-07-28 DIAGNOSIS — M62838 Other muscle spasm: Secondary | ICD-10-CM

## 2017-07-28 DIAGNOSIS — Z5181 Encounter for therapeutic drug level monitoring: Secondary | ICD-10-CM

## 2017-07-28 DIAGNOSIS — G894 Chronic pain syndrome: Secondary | ICD-10-CM | POA: Insufficient documentation

## 2017-07-28 DIAGNOSIS — M961 Postlaminectomy syndrome, not elsewhere classified: Secondary | ICD-10-CM

## 2017-07-28 DIAGNOSIS — Z79899 Other long term (current) drug therapy: Secondary | ICD-10-CM | POA: Diagnosis present

## 2017-07-28 DIAGNOSIS — M5416 Radiculopathy, lumbar region: Secondary | ICD-10-CM | POA: Diagnosis not present

## 2017-07-28 MED ORDER — OXYCODONE HCL 10 MG PO TABS: 10.0000 mg | ORAL_TABLET | Freq: Four times a day (QID) | ORAL | 0 refills | Status: DC | PRN

## 2017-07-28 NOTE — Progress Notes (Signed)
Subjective:    Patient ID: Logan Briggs, male    DOB: [birth year], 61 y.o.   MRN: [id number]  HPI: [name] is a 61year old male who returns for follow up appointment for chronic pain and medication refill. He states his pain is located in his lower back radiating into his left lower extremity. He rates his pain 7. His current exercise regime is walking, light yardworkand performing stretching exercises.  His Oral Swab was on 06/28/2017, it was consistent.  Pain Inventory Average Pain 7 Pain Right Now 7 My pain is sharp and stabbing  In the last 24 hours, has pain interfered with the following? General activity 6 Relation with others 6 Enjoyment of life 7 What TIME of day is your pain at its worst? evening, night Sleep (in general) Good  Pain is worse with: some activites Pain improves with: heat/ice and medication Relief from Meds: 7  Mobility walk without assistance ability to climb steps?  yes do you drive?  yes  Function Do you have any goals in this area?  no  Neuro/Psych No problems in this area  Prior Studies Any changes since last visit?  no  Physicians involved in your care Any changes since last visit?  no   No family history on file. Social History   Social History  . Marital status: Married    Spouse name: N/A  . Number of children: N/A  . Years of education: N/A   Social History Main Topics  . Smoking status: Current Every Day Smoker    Packs/day: 1.00    Years: 20.00    Types: Cigarettes  . Smokeless tobacco: Never Used     Comment: desires to quit but starts again when upset or stressed  . Alcohol use Yes     Comment: only on holidays, beer  . Drug use: No  . Sexual activity: No   Other Topics Concern  . None   Social History Narrative  . None   Past Surgical History:  Procedure Laterality Date  . BACK SURGERY     Past Medical History:  Diagnosis Date  . Chronic back pain   . Glaucoma    BP 114/76 (BP  Location: Right Arm, Patient Position: Sitting, Cuff Size: Normal)   Pulse 89   Resp 14   SpO2 95%   Opioid Risk Score:   Fall Risk Score:  `1  Depression screen PHQ 2/9  Depression screen [facility] 2/9 05/27/2017 03/29/2017 03/01/2017 01/05/2017 07/21/2015 06/23/2015 05/22/2015  Decreased Interest 0 0 0 0 0 0 0  Down, Depressed, Hopeless 0 0 0 0 0 0 0  PHQ - 2 Score 0 0 0 0 0 0 0  Altered sleeping - - - - - - 3  Tired, decreased energy - - - - - - 0  Change in appetite - - - - - - 0  Feeling bad or failure about yourself  - - - - - - 3  Trouble concentrating - - - - - - 0  Moving slowly or fidgety/restless - - - - - - 3  Suicidal thoughts - - - - - - 0  PHQ-9 Score - - - - - - 9    Review of Systems  Constitutional: Negative.   HENT: Negative.   Eyes: Negative.   Respiratory: Negative.   Cardiovascular: Negative.   Gastrointestinal: Negative.   Endocrine: Negative.   Genitourinary: Negative.   Musculoskeletal: Positive for back pain and myalgias.  Skin: Negative.   Allergic/Immunologic: Negative.   Neurological: Negative.   Hematological: Negative.   Psychiatric/Behavioral: Negative.   All other systems reviewed and are negative.      Objective:   Physical Exam  Constitutional: He is oriented to person, place, and time. He appears well-developed and well-nourished.  HENT:  Head: Normocephalic and atraumatic.  Neck: Normal range of motion. Neck supple.  Cardiovascular: Normal rate and regular rhythm.   Pulmonary/Chest: Effort normal and breath sounds normal.  Musculoskeletal:  Normal Muscle Bulk and Muscle Testing Reveals: Upper Extremities:Full  ROM and Muscle Strength 5/5 Back without spinal Tenderness Noted Lower Extremities: Full  ROM and Muscle Strength 5/5 Arises from table with Ease Narrow Based Gait  Neurological: He is alert and oriented to person, place, and time.  Skin: Skin is warm and dry.  Psychiatric: He has a normal mood and affect.  Nursing note and  vitals reviewed.         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 07/28/2017 Refilled: Oxycodone 10 mg one tablet 4 times a day as needed for pain #120. We will continue the opioid monitoring program, tis consists of regular clinic visits, examinations, urine drug screen, pill counts as well as use of [place] Controlled Substance Reporting System.  Continue with exercise regime. 2. Lumbar Radiculopathy: Continue to monitor, Logan Briggs reports he had discontinued taking his Gabapentin and Pamelor.Marland Kitchen He reports he had daytime drowsiness and no relief with medication. 07/28/2017 3. Muscle Spasm: Continue Baclofen. 07/28/2017  20 minutes of face to face patient care time was spent during this visit. All questions were encouraged and answered.  F/U in 1 month

## 2017-08-26 ENCOUNTER — Encounter: Payer: Self-pay | Admitting: Registered Nurse

## 2017-08-26 ENCOUNTER — Encounter: Payer: Medicare PPO | Attending: Physical Medicine & Rehabilitation | Admitting: Registered Nurse

## 2017-08-26 VITALS — BP 118/76 | HR 72

## 2017-08-26 DIAGNOSIS — M5416 Radiculopathy, lumbar region: Secondary | ICD-10-CM

## 2017-08-26 DIAGNOSIS — G894 Chronic pain syndrome: Secondary | ICD-10-CM | POA: Diagnosis not present

## 2017-08-26 DIAGNOSIS — Z79899 Other long term (current) drug therapy: Secondary | ICD-10-CM | POA: Diagnosis not present

## 2017-08-26 DIAGNOSIS — Z5181 Encounter for therapeutic drug level monitoring: Secondary | ICD-10-CM | POA: Diagnosis present

## 2017-08-26 DIAGNOSIS — M961 Postlaminectomy syndrome, not elsewhere classified: Secondary | ICD-10-CM

## 2017-08-26 MED ORDER — OXYCODONE HCL 10 MG PO TABS: 10.0000 mg | ORAL_TABLET | Freq: Four times a day (QID) | ORAL | 0 refills | Status: DC | PRN

## 2017-08-26 NOTE — Progress Notes (Signed)
Subjective:    Patient ID: Logan Briggs, male    DOB: [birth year], 61 y.o.   MRN: [id number]  HPI: [name] is a 61year old male who returns for follow up appointment for chronic pain and medication refill. He states his pain is located in his lower back radiating into his left lower extremity. He rates his pain 7. His current exercise regime is walking, light yardworkand performing stretching exercises.  Logan Briggs equivalent is 60.00 MME.   His Oral Swab was on 06/28/2017, it was consistent.  Pain Inventory Average Pain 7 Pain Right Now 7 My pain is sharp and stabbing  In the last 24 hours, has pain interfered with the following? General activity 5 Relation with others 5 Enjoyment of life 6 What TIME of day is your pain at its worst? evening and night, night Sleep (in general) Good  Pain is worse with: some activites Pain improves with: heat/ice and medication Relief from Meds: 7  Mobility walk without assistance ability to climb steps?  yes do you drive?  yes  Function Do you have any goals in this area?  no  Neuro/Psych No problems in this area  Prior Studies Any changes since last visit?  no  Physicians involved in your care Any changes since last visit?  no   No family history on file. Social History   Social History  . Marital status: Married    Spouse name: N/A  . Number of children: N/A  . Years of education: N/A   Social History Main Topics  . Smoking status: Current Every Day Smoker    Packs/day: 1.00    Years: 20.00    Types: Cigarettes  . Smokeless tobacco: Never Used     Comment: desires to quit but starts again when upset or stressed  . Alcohol use Yes     Comment: only on holidays, beer  . Drug use: No  . Sexual activity: No   Other Topics Concern  . None   Social History Narrative  . None   Past Surgical History:  Procedure Laterality Date  . BACK SURGERY     Past Medical History:  Diagnosis  Date  . Chronic back pain   . Glaucoma    BP 118/76   Pulse 72   SpO2 94%   Opioid Risk Score:  4 Fall Risk Score:  `1  Depression screen PHQ 2/9  Depression screen [facility] 2/9 08/26/2017 05/27/2017 03/29/2017 03/01/2017 01/05/2017 07/21/2015 06/23/2015  Decreased Interest 0 0 0 0 0 0 0  Down, Depressed, Hopeless 0 0 0 0 0 0 0  PHQ - 2 Score 0 0 0 0 0 0 0  Altered sleeping - - - - - - -  Tired, decreased energy - - - - - - -  Change in appetite - - - - - - -  Feeling bad or failure about yourself  - - - - - - -  Trouble concentrating - - - - - - -  Moving slowly or fidgety/restless - - - - - - -  Suicidal thoughts - - - - - - -  PHQ-9 Score - - - - - - -    Review of Systems  Constitutional: Negative.   HENT: Negative.   Eyes: Negative.   Respiratory: Negative.   Cardiovascular: Negative.   Gastrointestinal: Negative.   Endocrine: Negative.   Genitourinary: Negative.   Musculoskeletal: Positive for back pain and myalgias.  Skin: Negative.  Allergic/Immunologic: Negative.   Neurological: Negative.   Hematological: Negative.   Psychiatric/Behavioral: Negative.   All other systems reviewed and are negative.      Objective:   Physical Exam  Constitutional: He is oriented to person, place, and time. He appears well-developed and well-nourished.  HENT:  Head: Normocephalic and atraumatic.  Neck: Normal range of motion. Neck supple.  Cardiovascular: Normal rate and regular rhythm.   Pulmonary/Chest: Effort normal and breath sounds normal.  Musculoskeletal:  Normal Muscle Bulk and Muscle Testing Reveals: Upper Extremities:Full  ROM and Muscle Strength 5/5 Back without spinal Tenderness Noted Lower Extremities: Full  ROM and Muscle Strength 5/5 Arises from table with Ease Narrow Based Gait  Neurological: He is alert and oriented to person, place, and time.  Skin: Skin is warm and dry.  Psychiatric: He has a normal mood and affect.  Nursing note and vitals  reviewed.         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 08/26/2017 Refilled: Oxycodone 10 mg one tablet 4 times a day as needed for pain #120. We will continue the opioid monitoring program, tis consists of regular clinic visits, examinations, urine drug screen, pill counts as well as use of [place] Controlled Substance Reporting System.  Continue with exercise regime. 2. Lumbar Radiculopathy: Continue to monitor, Mr. Guster reports he has discontinued taking his Gabapentin and Pamelor.Marland Kitchen He reports side effect: daytime drowsiness and no relief with medication. 08/26/2017   20 minutes of face to face patient care time was spent during this visit. All questions were encouraged and answered.  F/U in 1 month

## 2017-08-30 ENCOUNTER — Telehealth: Payer: Self-pay | Admitting: Registered Nurse

## 2017-08-30 NOTE — Telephone Encounter (Signed)
On 08/30/2017 the  [place] was reviewed no conflict was seen on the [place] with multiple prescribers. Logan Briggs has a signed narcotic contract with our office. If there were any discrepancies this would have been reported to his physician.

## 2017-09-23 ENCOUNTER — Encounter: Payer: Medicare PPO | Attending: Physical Medicine & Rehabilitation

## 2017-09-23 ENCOUNTER — Ambulatory Visit (HOSPITAL_BASED_OUTPATIENT_CLINIC_OR_DEPARTMENT_OTHER): Payer: Medicare PPO | Admitting: Physical Medicine & Rehabilitation

## 2017-09-23 ENCOUNTER — Other Ambulatory Visit: Payer: Self-pay

## 2017-09-23 ENCOUNTER — Encounter: Payer: Self-pay | Admitting: Physical Medicine & Rehabilitation

## 2017-09-23 VITALS — BP 131/78 | HR 71 | Resp 14

## 2017-09-23 DIAGNOSIS — M5416 Radiculopathy, lumbar region: Secondary | ICD-10-CM | POA: Diagnosis not present

## 2017-09-23 DIAGNOSIS — M961 Postlaminectomy syndrome, not elsewhere classified: Secondary | ICD-10-CM | POA: Diagnosis not present

## 2017-09-23 DIAGNOSIS — Z5181 Encounter for therapeutic drug level monitoring: Secondary | ICD-10-CM | POA: Diagnosis present

## 2017-09-23 DIAGNOSIS — Z79899 Other long term (current) drug therapy: Secondary | ICD-10-CM | POA: Insufficient documentation

## 2017-09-23 DIAGNOSIS — G894 Chronic pain syndrome: Secondary | ICD-10-CM | POA: Insufficient documentation

## 2017-09-23 MED ORDER — OXYCODONE HCL 10 MG PO TABS: 10.0000 mg | ORAL_TABLET | Freq: Four times a day (QID) | ORAL | 0 refills | Status: DC | PRN

## 2017-09-23 NOTE — Patient Instructions (Signed)
Keep up with stool softener and miralax

## 2017-09-23 NOTE — Progress Notes (Signed)
Subjective:    Patient ID: Logan Briggs, male    DOB: 10/12/1956, 61 y.o.   MRN: [id number]  HPI 61 year old male with history of chronic left lower extremity radiculitis, history of L4-5 fusion who was last seen by me in June 2018.  He has been seen on a monthly basis by a nurse practitioner Ms. Thomas. Interval medical history significant for diagnosis of small cell lung cancer in September based on bronchoscopy biopsies.  Patient has initiated chemotherapy and will be soon starting radiation therapy as well.  He has mediastinal nodes on the involved side but not on the contralateral lung. He has quit smoking. From a pain standpoint he has not had any changes and his oxycodone continues to be effective for his pain relief.  We discussed constipation which may increase if his mobility declines during his cancer treatment.  Pain Inventory Average Pain 7 Pain Right Now 7 My pain is constant, sharp and stabbing  In the last 24 hours, has pain interfered with the following? General activity 5 Relation with others 5 Enjoyment of life 6 What TIME of day is your pain at its worst? evening, night Sleep (in general) Good  Pain is worse with: some activites Pain improves with: medication Relief from Meds: 7  Mobility walk without assistance ability to climb steps?  yes do you drive?  yes Do you have any goals in this area?  no  Function Do you have any goals in this area?  no  Neuro/Psych No problems in this area  Prior Studies Any changes since last visit?  no  Physicians involved in your care Any changes since last visit?  no   No family history on file. Social History   Socioeconomic History  . Marital status: Married    Spouse name: None  . Number of children: None  . Years of education: None  . Highest education level: None  Social Needs  . Financial resource strain: None  . Food insecurity - worry: None  . Food insecurity - inability: None  .  Transportation needs - medical: None  . Transportation needs - non-medical: None  Occupational History  . None  Tobacco Use  . Smoking status: Current Every Day Smoker    Packs/day: 1.00    Years: 20.00    Pack years: 20.00    Types: Cigarettes  . Smokeless tobacco: Never Used  . Tobacco comment: desires to quit but starts again when upset or stressed  Substance and Sexual Activity  . Alcohol use: Yes    Comment: only on holidays, beer  . Drug use: No  . Sexual activity: No  Other Topics Concern  . None  Social History Narrative  . None   Past Surgical History:  Procedure Laterality Date  . BACK SURGERY     Past Medical History:  Diagnosis Date  . Chronic back pain   . Glaucoma    BP 131/78 (BP Location: Left Arm, Patient Position: Sitting, Cuff Size: Large)   Pulse 71   Resp 14   SpO2 98%   Opioid Risk Score:   Fall Risk Score:  `1  Depression screen PHQ 2/9  Depression screen [facility] 2/9 08/26/2017 05/27/2017 03/29/2017 03/01/2017 01/05/2017 07/21/2015 06/23/2015  Decreased Interest 0 0 0 0 0 0 0  Down, Depressed, Hopeless 0 0 0 0 0 0 0  PHQ - 2 Score 0 0 0 0 0 0 0  Altered sleeping - - - - - - -  Tired,  decreased energy - - - - - - -  Change in appetite - - - - - - -  Feeling bad or failure about yourself  - - - - - - -  Trouble concentrating - - - - - - -  Moving slowly or fidgety/restless - - - - - - -  Suicidal thoughts - - - - - - -  PHQ-9 Score - - - - - - -    Review of Systems  Constitutional: Negative.   HENT: Negative.   Eyes: Negative.   Respiratory: Negative.   Cardiovascular: Negative.   Gastrointestinal: Negative.   Endocrine: Negative.   Genitourinary: Negative.   Musculoskeletal: Positive for arthralgias and myalgias.  Skin: Negative.   Allergic/Immunologic: Negative.   Neurological: Negative.        Neuralgia  Hematological: Negative.   Psychiatric/Behavioral: Negative.        Objective:   Physical Exam  Constitutional: He is  oriented to person, place, and time. He appears well-developed and well-nourished.  HENT:  Head: Normocephalic and atraumatic.  Eyes: Conjunctivae are normal. Pupils are equal, round, and reactive to light.  Musculoskeletal:  No tenderness to palpation over the lumbar paraspinal musculature.  No tenderness over the hip area. Negative straight leg raising Motor strength is 5/5 bilateral hip flexor knee extensor 5 in the right ankle dorsiflexor 4 on the left ankle dorsiflexor.  Gait is without assistive device no evidence of toe drag or knee instability.    Neurological: He is alert and oriented to person, place, and time.  Psychiatric: He has a normal mood and affect.  Nursing note and vitals reviewed.         Assessment & Plan:  1.  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 He is tried atypical anticonvulsants for his neuropathic pain but these have not been helpful No signs of aberrant drug behavior Continue opioid monitoring program. This consists of regular clinic visits, examinations, urine drug screen, pill counts as well as use of [place] controlled substance reporting System. Last drug screen 06/28/2017 was consistent Nurse practitioner visit in 1 month

## 2017-09-26 ENCOUNTER — Ambulatory Visit: Payer: Medicare PPO | Admitting: Physical Medicine & Rehabilitation

## 2017-10-26 ENCOUNTER — Encounter: Payer: Medicare PPO | Admitting: Registered Nurse

## 2017-11-07 ENCOUNTER — Encounter: Payer: Self-pay | Admitting: Physical Medicine & Rehabilitation

## 2017-11-07 ENCOUNTER — Ambulatory Visit: Payer: Medicare PPO | Admitting: Physical Medicine & Rehabilitation

## 2017-11-07 ENCOUNTER — Encounter: Payer: Medicare PPO | Attending: Physical Medicine & Rehabilitation

## 2017-11-07 VITALS — BP 136/78 | HR 83

## 2017-11-07 DIAGNOSIS — M961 Postlaminectomy syndrome, not elsewhere classified: Secondary | ICD-10-CM | POA: Diagnosis not present

## 2017-11-07 DIAGNOSIS — M5416 Radiculopathy, lumbar region: Secondary | ICD-10-CM

## 2017-11-07 DIAGNOSIS — G894 Chronic pain syndrome: Secondary | ICD-10-CM | POA: Diagnosis present

## 2017-11-07 DIAGNOSIS — Z79899 Other long term (current) drug therapy: Secondary | ICD-10-CM | POA: Diagnosis not present

## 2017-11-07 DIAGNOSIS — Z5181 Encounter for therapeutic drug level monitoring: Secondary | ICD-10-CM | POA: Diagnosis present

## 2017-11-07 MED ORDER — OXYCODONE HCL 10 MG PO TABS: 10.0000 mg | ORAL_TABLET | Freq: Four times a day (QID) | ORAL | 0 refills | Status: DC | PRN

## 2017-11-07 NOTE — Progress Notes (Signed)
Subjective:    Patient ID: Logan Briggs, male    DOB: 01/19/1956, 61 y.o.   MRN: [id number]  HPI  61 year old male followed at this clinic for history of chronic radiculopathy and low back pain.  He has a history of L4-L5 fusion in the past.  Has been on chronic narcotic analgesics for several years.  More recently he has been diagnosed with small cell lung carcinoma and is getting treatment through the cancer center in [facility]. Still undergoing radiation therapy and second round of chemo is coming up Trying to stay hydrated No diarrhea, has poor appetite.  Has lost ~25lb Chronic Left foot and great toe numbness Out of pain meds x 4 d pain level increased from 5/10- 7/10 Constipated , takes stool softener  Pain Inventory Average Pain 7 Pain Right Now 7 My pain is sharp and stabbing  In the last 24 hours, has pain interfered with the following? General activity 5 Relation with others 5 Enjoyment of life 6 What TIME of day is your pain at its worst? evening Sleep (in general) Good  Pain is worse with: some activites Pain improves with: heat/ice and medication Relief from Meds: 7  Mobility walk without assistance ability to climb steps?  no do you drive?  yes  Function Do you have any goals in this area?  no  Neuro/Psych No problems in this area  Prior Studies Any changes since last visit?  no  Physicians involved in your care Any changes since last visit?  no   No family history on file. Social History   Socioeconomic History  . Marital status: Married    Spouse name: None  . Number of children: None  . Years of education: None  . Highest education level: None  Social Needs  . Financial resource strain: None  . Food insecurity - worry: None  . Food insecurity - inability: None  . Transportation needs - medical: None  . Transportation needs - non-medical: None  Occupational History  . None  Tobacco Use  . Smoking status: Former Smoker   Packs/day: 0.00    Years: 20.00    Pack years: 0.00    Types: Cigarettes  . Smokeless tobacco: Never Used  . Tobacco comment: desires to quit but starts again when upset or stressed  Substance and Sexual Activity  . Alcohol use: Yes    Comment: only on holidays, beer  . Drug use: No  . Sexual activity: No  Other Topics Concern  . None  Social History Narrative  . None   Past Surgical History:  Procedure Laterality Date  . BACK SURGERY     Past Medical History:  Diagnosis Date  . Chronic back pain   . Glaucoma    There were no vitals taken for this visit.  Opioid Risk Score:   Fall Risk Score:  `1  Depression screen PHQ 2/9  Depression screen [facility] 2/9 08/26/2017 05/27/2017 03/29/2017 03/01/2017 01/05/2017 07/21/2015 06/23/2015  Decreased Interest 0 0 0 0 0 0 0  Down, Depressed, Hopeless 0 0 0 0 0 0 0  PHQ - 2 Score 0 0 0 0 0 0 0  Altered sleeping - - - - - - -  Tired, decreased energy - - - - - - -  Change in appetite - - - - - - -  Feeling bad or failure about yourself  - - - - - - -  Trouble concentrating - - - - - - -  Moving slowly or fidgety/restless - - - - - - -  Suicidal thoughts - - - - - - -  PHQ-9 Score - - - - - - -     Review of Systems  Constitutional: Negative.   HENT: Negative.   Eyes: Negative.   Respiratory: Negative.   Cardiovascular: Negative.   Gastrointestinal: Negative.   Endocrine: Negative.   Genitourinary: Negative.   Musculoskeletal: Negative.   Skin: Negative.   Allergic/Immunologic: Negative.   Neurological: Negative.   Hematological: Negative.   Psychiatric/Behavioral: Negative.   All other systems reviewed and are negative.      Objective:   Physical Exam  Constitutional: He is oriented to person, place, and time. He appears well-developed and well-nourished. No distress.  HENT:  Head: Normocephalic and atraumatic.  Eyes: Conjunctivae and EOM are normal. Pupils are equal, round, and reactive to light.  Neurological: He is  alert and oriented to person, place, and time. Coordination and gait normal.  Reflex Scores:      Patellar reflexes are 2+ on the right side and 0 on the left side.      Achilles reflexes are 0 on the right side and 0 on the left side. Motor strength is 5/5 bilateral hip flexor knee extensor ankle dorsiflexor Negative straight leg raise Gait is without evidence of toe drag or knee instability.  Skin: He is not diaphoretic.  Psychiatric: He has a normal mood and affect. His behavior is normal. Judgment and thought content normal.  Nursing note and vitals reviewed.         Assessment & Plan:  1.  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  Will resume oxycodone 10 mg 4 times daily Recommend twice daily stool softeners for constipation Recommend bisacodyl suppository 10 mg every 3 days if no bowel movement. Physical medicine rehab follow-up visit in 4 weeks with nurse practitioner 2.  Small cell lung carcinoma follow-up with oncology as well as radiation oncology at [facility] He has not developed any chemo-induced neuropathy.  In the past he did not respond well to gabapentin for his radiculopathy.

## 2017-11-07 NOTE — Patient Instructions (Signed)
Bisacodyl (Dulcolax) suppository  Increase stool softener to twice a day

## 2017-12-05 ENCOUNTER — Encounter: Payer: Medicare PPO | Attending: Physical Medicine & Rehabilitation | Admitting: Registered Nurse

## 2017-12-05 ENCOUNTER — Encounter: Payer: Self-pay | Admitting: Registered Nurse

## 2017-12-05 ENCOUNTER — Other Ambulatory Visit: Payer: Self-pay

## 2017-12-05 VITALS — BP 145/81 | HR 91 | Temp 98.3°F

## 2017-12-05 DIAGNOSIS — G894 Chronic pain syndrome: Secondary | ICD-10-CM | POA: Diagnosis not present

## 2017-12-05 DIAGNOSIS — M961 Postlaminectomy syndrome, not elsewhere classified: Secondary | ICD-10-CM | POA: Diagnosis not present

## 2017-12-05 DIAGNOSIS — M5416 Radiculopathy, lumbar region: Secondary | ICD-10-CM

## 2017-12-05 DIAGNOSIS — Z79899 Other long term (current) drug therapy: Secondary | ICD-10-CM

## 2017-12-05 DIAGNOSIS — Z5181 Encounter for therapeutic drug level monitoring: Secondary | ICD-10-CM | POA: Diagnosis not present

## 2017-12-05 MED ORDER — OXYCODONE HCL 10 MG PO TABS: 10.0000 mg | ORAL_TABLET | Freq: Four times a day (QID) | ORAL | 0 refills | Status: DC | PRN

## 2017-12-05 NOTE — Progress Notes (Signed)
Subjective:    Patient ID: Logan Briggs, male    DOB: [birth year], 62 y.o.   MRN: [id number]  HPI: [name] is a 62year old male who returns for follow up appointment for chronic pain and medication refill. He states his pain is located in his lower back radiating into his left lower extremity. He rates his pain 7. His current exercise regime is walking.  [name] has been diagnosed with small cell lung cancer, has completed radiation he states. Also states he has to complete two more rounds of Chemotherapy.   [name] went to [facility] Emergency Department on 11/19/2017 for dehydration, weakness, N&V and diarrhea. He was admitted and Discharged on 11/21/2017. Notes were reviewed.    [name] Morphine equivalent is 64.00 MME.   His Oral Swab was on 06/28/2017, it was consistent.  Pain Inventory Average Pain 7 Pain Right Now 7 My pain is sharp and stabbing  In the last 24 hours, has pain interfered with the following? General activity 5 Relation with others 5 Enjoyment of life 6 What TIME of day is your pain at its worst? evening and night, night Sleep (in general) Good  Pain is worse with: some activites Pain improves with: heat/ice and medication Relief from Meds: 7  Mobility walk without assistance ability to climb steps?  yes do you drive?  yes  Function Do you have any goals in this area?  no  Neuro/Psych No problems in this area  Prior Studies Any changes since last visit?  no  Physicians involved in your care Any changes since last visit?  no   No family history on file. Social History   Socioeconomic History  . Marital status: Married    Spouse name: None  . Number of children: None  . Years of education: None  . Highest education level: None  Social Needs  . Financial resource strain: None  . Food insecurity - worry: None  . Food insecurity - inability: None  . Transportation needs - medical: None  .  Transportation needs - non-medical: None  Occupational History  . None  Tobacco Use  . Smoking status: Former Smoker    Packs/day: 0.00    Years: 20.00    Pack years: 0.00    Types: Cigarettes  . Smokeless tobacco: Never Used  . Tobacco comment: desires to quit but starts again when upset or stressed  Substance and Sexual Activity  . Alcohol use: Yes    Comment: only on holidays, beer  . Drug use: No  . Sexual activity: No  Other Topics Concern  . None  Social History Narrative  . None   Past Surgical History:  Procedure Laterality Date  . BACK SURGERY     Past Medical History:  Diagnosis Date  . Chronic back pain   . Glaucoma    BP (!) 145/81   Pulse 91   Temp 98.3 F (36.8 C)   SpO2 95%   Opioid Risk Score:  4 Fall Risk Score:  `1  Depression screen PHQ 2/9  Depression screen [facility] 2/9 12/05/2017 08/26/2017 05/27/2017 03/29/2017 03/01/2017 01/05/2017 07/21/2015  Decreased Interest 0 0 0 0 0 0 0  Down, Depressed, Hopeless 0 0 0 0 0 0 0  PHQ - 2 Score 0 0 0 0 0 0 0  Altered sleeping - - - - - - -  Tired, decreased energy - - - - - - -  Change in appetite - - - - - - -  Feeling bad or failure about yourself  - - - - - - -  Trouble concentrating - - - - - - -  Moving slowly or fidgety/restless - - - - - - -  Suicidal thoughts - - - - - - -  PHQ-9 Score - - - - - - -    Review of Systems  Constitutional: Negative.   HENT: Negative.   Eyes: Negative.   Respiratory: Negative.   Cardiovascular: Negative.   Gastrointestinal: Negative.   Endocrine: Negative.   Genitourinary: Negative.   Musculoskeletal: Positive for back pain and myalgias.  Skin: Negative.   Allergic/Immunologic: Negative.   Neurological: Negative.   Hematological: Negative.   Psychiatric/Behavioral: Negative.   All other systems reviewed and are negative.      Objective:   Physical Exam  Constitutional: He is oriented to person, place, and time. He appears well-developed and  well-nourished.  HENT:  Head: Normocephalic and atraumatic.  Neck: Normal range of motion. Neck supple.  Cardiovascular: Normal rate and regular rhythm.  Pulmonary/Chest: Effort normal and breath sounds normal.  Musculoskeletal:  Normal Muscle Bulk and Muscle Testing Reveals: Upper Extremities: Full ROM and Muscle Strength 5/5 Back without spinal Tenderness Noted Lower Extremities: Full ROM and Muscle Strength 5/5 Arises from table with Ease Narrow Based Gait  Neurological: He is alert and oriented to person, place, and time.  Skin: Skin is warm and dry.  Psychiatric: He has a normal mood and affect.  Nursing note and vitals reviewed.         Assessment & Plan:  1.Lumbar post lami syndrome s/p L4-5 lami and fusion.12/05/2017 Refilled: Oxycodone 10 mg one tablet 4 times a day as needed for pain #120. We will continue the opioid monitoring program, tis consists of regular clinic visits, examinations, urine drug screen, pill counts as well as use of [place] Controlled Substance Reporting System.  Continue with exercise regime. 2. Lumbar Radiculopathy: Continue to monitor, [name] reports he has discontinued taking his Gabapentin and Pamelor.Marland Kitchen He reports side effect: daytime drowsiness and no relief with medication. 12/05/2017   20 minutes of face to face patient care time was spent during this visit. All questions were encouraged and answered.  F/U in 1 month

## 2018-01-03 ENCOUNTER — Encounter: Payer: Medicare PPO | Attending: Physical Medicine & Rehabilitation | Admitting: Registered Nurse

## 2018-01-03 ENCOUNTER — Encounter: Payer: Self-pay | Admitting: Registered Nurse

## 2018-01-03 VITALS — BP 156/83 | HR 88

## 2018-01-03 DIAGNOSIS — Z5181 Encounter for therapeutic drug level monitoring: Secondary | ICD-10-CM | POA: Diagnosis present

## 2018-01-03 DIAGNOSIS — M961 Postlaminectomy syndrome, not elsewhere classified: Secondary | ICD-10-CM | POA: Diagnosis not present

## 2018-01-03 DIAGNOSIS — Z79899 Other long term (current) drug therapy: Secondary | ICD-10-CM

## 2018-01-03 DIAGNOSIS — M5416 Radiculopathy, lumbar region: Secondary | ICD-10-CM

## 2018-01-03 DIAGNOSIS — G894 Chronic pain syndrome: Secondary | ICD-10-CM | POA: Insufficient documentation

## 2018-01-03 MED ORDER — OXYCODONE HCL 10 MG PO TABS: 10.0000 mg | ORAL_TABLET | Freq: Four times a day (QID) | ORAL | 0 refills | Status: DC | PRN

## 2018-01-03 NOTE — Progress Notes (Deleted)
Patient had blood transfusion and Chemo Therapy today so Logan Briggs advised best wait to do UDS on next follow up.

## 2018-01-03 NOTE — Progress Notes (Signed)
Subjective:    Patient ID: Logan Briggs, male    DOB: Dec 25, 1955, 62 y.o.   MRN: [id number]  HPI: [name] is a 62year old male who returns for follow up appointment for chronic pain and medication refill. He states his pain is located in his lower back radiating into his left lower extremity. Also reports he has completed his chemotherapy and radiation and has noticed an increase intensity of neuropathic pain, he wants to speak with his Oncologist regarding the neuropathic pain.  He rates his pain 7. His current exercise regime is walking. [name] was transfused with PRBC on 01/02/2018 and 01/03/2018, also states his appetite is poor he's drinking boost nutritional supplement.   [name] has been diagnosed with small cell lung cancer, Oncology following.   [name] equivalent is 64.00 MME.   His Oral Swab was on 06/28/2017, it was consistent. Oral Swab Performed Today.  Pain Inventory Average Pain 7 Pain Right Now 7 My pain is sharp and stabbing  In the last 24 hours, has pain interfered with the following? General activity 5 Relation with others 5 Enjoyment of life 6 What TIME of day is your pain at its worst? evening and night, night Sleep (in general) Good  Pain is worse with: some activites Pain improves with: heat/ice and medication Relief from Meds: 7  Mobility walk without assistance ability to climb steps?  yes do you drive?  yes  Function Do you have any goals in this area?  no  Neuro/Psych No problems in this area  Prior Studies Any changes since last visit?  no  Physicians involved in your care Any changes since last visit?  no   No family history on file. Social History   Socioeconomic History  . Marital status: Married    Spouse name: Not on file  . Number of children: Not on file  . Years of education: Not on file  . Highest education level: Not on file  Social Needs  . Financial resource strain: Not on file   . Food insecurity - worry: Not on file  . Food insecurity - inability: Not on file  . Transportation needs - medical: Not on file  . Transportation needs - non-medical: Not on file  Occupational History  . Not on file  Tobacco Use  . Smoking status: Former Smoker    Packs/day: 0.00    Years: 20.00    Pack years: 0.00    Types: Cigarettes  . Smokeless tobacco: Never Used  . Tobacco comment: desires to quit but starts again when upset or stressed  Substance and Sexual Activity  . Alcohol use: Yes    Comment: only on holidays, beer  . Drug use: No  . Sexual activity: No  Other Topics Concern  . Not on file  Social History Narrative  . Not on file   Past Surgical History:  Procedure Laterality Date  . BACK SURGERY     Past Medical History:  Diagnosis Date  . Chronic back pain   . Glaucoma    There were no vitals taken for this visit.  Opioid Risk Score:  4 Fall Risk Score:  `1  Depression screen PHQ 2/9  Depression screen [facility] 2/9 12/05/2017 08/26/2017 05/27/2017 03/29/2017 03/01/2017 01/05/2017 07/21/2015  Decreased Interest 0 0 0 0 0 0 0  Down, Depressed, Hopeless 0 0 0 0 0 0 0  PHQ - 2 Score 0 0 0 0 0 0 0  Altered  sleeping - - - - - - -  Tired, decreased energy - - - - - - -  Change in appetite - - - - - - -  Feeling bad or failure about yourself  - - - - - - -  Trouble concentrating - - - - - - -  Moving slowly or fidgety/restless - - - - - - -  Suicidal thoughts - - - - - - -  PHQ-9 Score - - - - - - -    Review of Systems  Constitutional: Negative.   HENT: Negative.   Eyes: Negative.   Respiratory: Negative.   Cardiovascular: Negative.   Gastrointestinal: Negative.   Endocrine: Negative.   Genitourinary: Negative.   Musculoskeletal: Positive for arthralgias, back pain and myalgias.  Skin: Negative.   Allergic/Immunologic: Negative.   Neurological: Negative.   Hematological: Negative.   Psychiatric/Behavioral: Negative.   All other systems reviewed and  are negative.      Objective:   Physical Exam  Constitutional: He is oriented to person, place, and time. He appears well-developed and well-nourished.  HENT:  Head: Normocephalic and atraumatic.  Neck: Normal range of motion. Neck supple.  Cardiovascular: Normal rate and regular rhythm.  Pulmonary/Chest: Effort normal and breath sounds normal.  Musculoskeletal:  Normal Muscle Bulk and Muscle Testing Reveals: Upper Extremities: Full ROM and Muscle Strength 4/5 Lumbar Paraspinal Tenderness: L-3-L-5 Lower Extremities: Full ROM and Muscle Strength 5/5 Arises from table with Ease Narrow Based Gait  Neurological: He is alert and oriented to person, place, and time.  Skin: Skin is warm and dry. No erythema.  Psychiatric: He has a normal mood and affect.  Nursing note and vitals reviewed.         Assessment & Plan:  1.Lumbar post lami syndrome s/p L4-5 lami and fusion.01/03/2018 Refilled: Oxycodone 10 mg one tablet 4 times a day as needed for pain #120. We will continue the opioid monitoring program, tis consists of regular clinic visits, examinations, urine drug screen, pill counts as well as use of [place] Controlled Substance Reporting System.  Continue with exercise regime. 2. Lumbar Radiculopathy: Continue to monitor, [name] reported he had discontinued taking his Gabapentin and Pamelor. He reports side effect: daytime drowsiness and no relief with medication. 01/03/2018   20 minutes of face to face patient care time was spent during this visit. All questions were encouraged and answered.  F/U in 1 month

## 2018-01-07 LAB — DRUG TOX MONITOR 1 W/CONF, ORAL FLD
Amphetamines: NEGATIVE ng/mL (ref <10)
BUPRENORPHINE: NEGATIVE ng/mL (ref <0.10)
Barbiturates: NEGATIVE ng/mL (ref <10)
Benzodiazepines: NEGATIVE ng/mL (ref <0.50)
COCAINE: NEGATIVE ng/mL (ref <5.0)
Codeine: NEGATIVE ng/mL (ref <2.5)
DIHYDROCODEINE: NEGATIVE ng/mL (ref <2.5)
Fentanyl: NEGATIVE ng/mL (ref <0.10)
HYDROCODONE: NEGATIVE ng/mL (ref <2.5)
Heroin Metabolite: NEGATIVE ng/mL (ref <1.0)
Hydromorphone: NEGATIVE ng/mL (ref <2.5)
MARIJUANA: NEGATIVE ng/mL (ref <2.5)
MDMA: NEGATIVE ng/mL (ref <10)
MORPHINE: NEGATIVE ng/mL (ref <2.5)
Meprobamate: NEGATIVE ng/mL (ref <2.5)
Methadone: NEGATIVE ng/mL (ref <5.0)
NORHYDROCODONE: NEGATIVE ng/mL (ref <2.5)
Nicotine Metabolite: NEGATIVE ng/mL (ref <5.0)
Noroxycodone: NEGATIVE ng/mL (ref <2.5)
Opiates: POSITIVE ng/mL — AB (ref <2.5)
Oxycodone: 4.7 ng/mL — ABNORMAL HIGH (ref <2.5)
Oxymorphone: NEGATIVE ng/mL (ref <2.5)
Phencyclidine: NEGATIVE ng/mL (ref <10)
TRAMADOL: NEGATIVE ng/mL (ref <5.0)
Tapentadol: NEGATIVE ng/mL (ref <5.0)
ZOLPIDEM: NEGATIVE ng/mL (ref <5.0)

## 2018-01-07 LAB — DRUG TOX ALC METAB W/CON, ORAL FLD: ALCOHOL METABOLITE: NEGATIVE ng/mL (ref <25)

## 2018-01-09 ENCOUNTER — Telehealth: Payer: Self-pay | Admitting: *Deleted

## 2018-01-09 NOTE — Telephone Encounter (Signed)
Urine drug screen for this encounter is consistent for prescribed medication 

## 2018-01-10 ENCOUNTER — Telehealth: Payer: Self-pay

## 2018-01-10 NOTE — Telephone Encounter (Signed)
Return Mr. Kron call no answer, left message to return the call.

## 2018-01-10 NOTE — Telephone Encounter (Signed)
Patient called requesting a call back from [place].

## 2018-01-25 ENCOUNTER — Ambulatory Visit: Payer: Medicare PPO | Admitting: Registered Nurse

## 2018-01-26 ENCOUNTER — Encounter: Payer: Medicare PPO | Admitting: Registered Nurse

## 2018-01-26 ENCOUNTER — Telehealth: Payer: Self-pay | Admitting: Registered Nurse

## 2018-01-26 MED ORDER — OXYCODONE HCL 10 MG PO TABS: 10.0000 mg | ORAL_TABLET | Freq: Four times a day (QID) | ORAL | 0 refills | Status: DC | PRN

## 2018-01-26 NOTE — Telephone Encounter (Signed)
[name] arrived to his appointment about two hours late, his appointment was rescheduled. We will e-scribe his Oxycodone this month, he verbalizes understanding.  According to PMP Aware Web-site his Oxycodone was picked up on 01/03/2018.

## 2018-01-27 ENCOUNTER — Encounter: Payer: Medicare PPO | Admitting: Registered Nurse

## 2018-02-21 ENCOUNTER — Encounter: Payer: Self-pay | Admitting: Registered Nurse

## 2018-02-21 ENCOUNTER — Encounter: Payer: Medicare PPO | Attending: Physical Medicine & Rehabilitation | Admitting: Registered Nurse

## 2018-02-21 ENCOUNTER — Other Ambulatory Visit: Payer: Self-pay

## 2018-02-21 ENCOUNTER — Encounter: Payer: Medicare PPO | Admitting: Registered Nurse

## 2018-02-21 VITALS — BP 155/70 | HR 74 | Ht 76.0 in | Wt 219.0 lb

## 2018-02-21 DIAGNOSIS — Z5181 Encounter for therapeutic drug level monitoring: Secondary | ICD-10-CM | POA: Insufficient documentation

## 2018-02-21 DIAGNOSIS — Z79899 Other long term (current) drug therapy: Secondary | ICD-10-CM | POA: Diagnosis present

## 2018-02-21 DIAGNOSIS — M545 Low back pain: Secondary | ICD-10-CM

## 2018-02-21 DIAGNOSIS — G894 Chronic pain syndrome: Secondary | ICD-10-CM | POA: Diagnosis present

## 2018-02-21 DIAGNOSIS — M961 Postlaminectomy syndrome, not elsewhere classified: Secondary | ICD-10-CM | POA: Diagnosis not present

## 2018-02-21 DIAGNOSIS — G8929 Other chronic pain: Secondary | ICD-10-CM | POA: Diagnosis not present

## 2018-02-21 MED ORDER — OXYCODONE HCL 10 MG PO TABS: 10.0000 mg | ORAL_TABLET | Freq: Four times a day (QID) | ORAL | 0 refills | Status: DC | PRN

## 2018-02-21 NOTE — Progress Notes (Signed)
Subjective:    Patient ID: Logan Briggs, male    DOB: [birth year], 62 y.o.   MRN: [id number]  HPI: [name] is a 62 year old male who returns for follow up appointment for chronic pain and medication refill. He states his pain is located in his lower back. He rates his pain 7. His current exercise regime is walking.   [name].   Logan Briggs was Performed on 01/03/2018 it was consistent.   [name] for small cell lung cancer, oncology following.   Pain Inventory Average Pain 7 Pain Right Now 7 My pain is sharp and stabbing  In the last 24 hours, has pain interfered with the following? General activity 5 Relation with others 5 Enjoyment of life 6 What TIME of day is your pain at its worst? evening night Sleep (in general) Good  Pain is worse with: some activites Pain improves with: heat/ice and medication Relief from Meds: 7  Mobility ability to climb steps?  yes do you drive?  yes Do you have any goals in this area?  no  Function Do you have any goals in this area?  no  Neuro/Psych numbness tingling trouble walking  Prior Studies bone scan x-rays CT/MRI nerve study  Physicians involved in your care Any changes since last visit?  no   No family history on file. Social History   Socioeconomic History  . Marital status: Married    Spouse name: Not on file  . Number of children: Not on file  . Years of education: Not on file  . Highest education level: Not on file  Occupational History  . Not on file  Social Needs  . Financial resource strain: Not on file  . Food insecurity:    Worry: Not on file    Inability: Not on file  . Transportation needs:    Medical: Not on file    Non-medical: Not on file  Tobacco Use  . Smoking status: Former Smoker    Packs/day: 0.00    Years: 20.00    Pack years: 0.00    Types: Cigarettes  . Smokeless tobacco: Never  Used  . Tobacco comment: desires to quit but starts again when upset or stressed  Substance and Sexual Activity  . Alcohol use: Yes    Comment: only on holidays, beer  . Drug use: No  . Sexual activity: Never  Lifestyle  . Physical activity:    Days per week: Not on file    Minutes per session: Not on file  . Stress: Not on file  Relationships  . Social connections:    Talks on phone: Not on file    Gets together: Not on file    Attends religious service: Not on file    Active member of club or organization: Not on file    Attends meetings of clubs or organizations: Not on file    Relationship status: Not on file  Other Topics Concern  . Not on file  Social History Narrative  . Not on file   Past Surgical History:  Procedure Laterality Date  . BACK SURGERY     Past Medical History:  Diagnosis Date  . Chronic back pain   . Glaucoma    BP (!) 155/70   Pulse 74   Ht 6\' 4"  (1.93 m)   Wt 219 lb (99.3 kg)   SpO2 96%   BMI 26.66  kg/m   Opioid Risk Score:   Fall Risk Score:  `1  Depression screen PHQ 2/9  Depression screen [facility] 2/9 02/21/2018 12/05/2017 08/26/2017 05/27/2017 03/29/2017 03/01/2017 01/05/2017  Decreased Interest 0 0 0 0 0 0 0  Down, Depressed, Hopeless 0 0 0 0 0 0 0  PHQ - 2 Score 0 0 0 0 0 0 0  Altered sleeping - - - - - - -  Tired, decreased energy - - - - - - -  Change in appetite - - - - - - -  Feeling bad or failure about yourself  - - - - - - -  Trouble concentrating - - - - - - -  Moving slowly or fidgety/restless - - - - - - -  Suicidal thoughts - - - - - - -  PHQ-9 Score - - - - - - -    Review of Systems  Constitutional: Negative.   HENT: Negative.   Eyes: Negative.   Respiratory: Negative.   Cardiovascular: Negative.   Gastrointestinal: Negative.   Endocrine: Negative.   Genitourinary: Negative.   Musculoskeletal: Negative.   Allergic/Immunologic: Negative.   Neurological: Negative.   Hematological: Negative.     Psychiatric/Behavioral: Negative.   All other systems reviewed and are negative.      Objective:   Physical Exam  Constitutional: He is oriented to person, place, and time. He appears well-developed and well-nourished.  HENT:  Head: Normocephalic and atraumatic.  Neck: Normal range of motion. Neck supple.  Cardiovascular: Normal rate and regular rhythm.  Pulmonary/Chest: Effort normal and breath sounds normal.  Musculoskeletal:  Normal Muscle Bulk and Muscle Testing Reveals: Upper Extremities: Full ROM and Muscle Strength 5/5 Back without spinal tenderness Lower Extremities: Full ROM and Muscle Strength 5/5 Arises from chair slowly Narrow Based Gait  Neurological: He is alert and oriented to person, place, and time.  Skin: Skin is warm and dry.  Psychiatric: He has a normal mood and affect.  Nursing note and vitals reviewed.         Assessment & Plan:  1.Lumbar post lami syndrome s/p L4-5 lami and fusion.02/21/2018 Refilled: Oxycodone 10 mg one tablet 4 times a day as needed for pain #120. We will continue the opioid monitoring program, tis consists of regular clinic visits, examinations, urine drug screen, pill counts as well as use of [place] Controlled Substance Reporting System.  Continue with exercise regime. 2. Lumbar Radiculopathy: No complaints today. Continue to monitor, [name] reported he had discontinued taking his Gabapentin and Pamelor. He reports side effect: daytime drowsiness and no relief with medication. 02/21/2018  20 minutes of face to face patient care time was spent during this visit. All questions were encouraged and answered.  F/U in 1 month

## 2018-03-29 ENCOUNTER — Encounter: Payer: Self-pay | Admitting: Registered Nurse

## 2018-03-29 ENCOUNTER — Encounter: Payer: Medicare PPO | Attending: Physical Medicine & Rehabilitation | Admitting: Registered Nurse

## 2018-03-29 VITALS — BP 134/75 | HR 77 | Ht 76.0 in | Wt 229.0 lb

## 2018-03-29 DIAGNOSIS — Z5181 Encounter for therapeutic drug level monitoring: Secondary | ICD-10-CM | POA: Insufficient documentation

## 2018-03-29 DIAGNOSIS — M5416 Radiculopathy, lumbar region: Secondary | ICD-10-CM | POA: Diagnosis not present

## 2018-03-29 DIAGNOSIS — M961 Postlaminectomy syndrome, not elsewhere classified: Secondary | ICD-10-CM

## 2018-03-29 DIAGNOSIS — Z79899 Other long term (current) drug therapy: Secondary | ICD-10-CM | POA: Insufficient documentation

## 2018-03-29 DIAGNOSIS — G894 Chronic pain syndrome: Secondary | ICD-10-CM

## 2018-03-29 MED ORDER — OXYCODONE HCL 10 MG PO TABS: 10.0000 mg | ORAL_TABLET | Freq: Four times a day (QID) | ORAL | 0 refills | Status: DC | PRN

## 2018-03-29 NOTE — Progress Notes (Signed)
Subjective:    Patient ID: Logan Briggs, male    DOB: May 15, 1956, 62 y.o.   MRN: [id number]  HPI: [name] is a 62 year old male who returns for follow up appointment for chronic pain and medication refill. He states his pain is located in his lower back radiating into his left lower extremity. He rates his pain 7. His current exercise regime is walking.   Mr. [name] Morphine Equivalent is 60.00 MME.  Last UDS was Performed on 01/03/2018, it was consistent.   Pain Inventory Average Pain 7 Pain Right Now 7 My pain is sharp and stabbing  In the last 24 hours, has pain interfered with the following? General activity 5 Relation with others 5 Enjoyment of life 6 What TIME of day is your pain at its worst? evening Sleep (in general) Good  Pain is worse with: some activites Pain improves with: heat/ice and medication Relief from Meds: 7  Mobility ability to climb steps?  yes do you drive?  yes  Function Do you have any goals in this area?  no  Neuro/Psych No problems in this area  Prior Studies Any changes since last visit?  no  Physicians involved in your care Any changes since last visit?  no   No family history on file. Social History   Socioeconomic History  . Marital status: Married    Spouse name: Not on file  . Number of children: Not on file  . Years of education: Not on file  . Highest education level: Not on file  Occupational History  . Not on file  Social Needs  . Financial resource strain: Not on file  . Food insecurity:    Worry: Not on file    Inability: Not on file  . Transportation needs:    Medical: Not on file    Non-medical: Not on file  Tobacco Use  . Smoking status: Former Smoker    Packs/day: 0.00    Years: 20.00    Pack years: 0.00    Types: Cigarettes  . Smokeless tobacco: Never Used  . Tobacco comment: desires to quit but starts again when upset or stressed  Substance and Sexual Activity  . Alcohol use: Yes    Comment: only on holidays, beer  . Drug use: No  . Sexual activity: Never  Lifestyle  . Physical activity:    Days per week: Not on file    Minutes per session: Not on file  . Stress: Not on file  Relationships  . Social connections:    Talks on phone: Not on file    Gets together: Not on file    Attends religious service: Not on file    Active member of club or organization: Not on file    Attends meetings of clubs or organizations: Not on file    Relationship status: Not on file  Other Topics Concern  . Not on file  Social History Narrative  . Not on file   Past Surgical History:  Procedure Laterality Date  . BACK SURGERY     Past Medical History:  Diagnosis Date  . Chronic back pain   . Glaucoma    There were no vitals taken for this visit.  Opioid Risk Score:   Fall Risk Score:  `1  Depression screen PHQ 2/9  Depression screen [facility] 2/9 02/21/2018 12/05/2017 08/26/2017 05/27/2017 03/29/2017 03/01/2017 01/05/2017  Decreased Interest 0 0 0 0 0 0 0  Down, Depressed, Hopeless 0  0 0 0 0 0 0  PHQ - 2 Score 0 0 0 0 0 0 0  Altered sleeping - - - - - - -  Tired, decreased energy - - - - - - -  Change in appetite - - - - - - -  Feeling bad or failure about yourself  - - - - - - -  Trouble concentrating - - - - - - -  Moving slowly or fidgety/restless - - - - - - -  Suicidal thoughts - - - - - - -  PHQ-9 Score - - - - - - -     Review of Systems  Constitutional: Negative.   HENT: Negative.   Eyes: Negative.   Respiratory: Negative.   Cardiovascular: Negative.   Gastrointestinal: Negative.   Endocrine: Negative.   Genitourinary: Negative.   Musculoskeletal: Positive for arthralgias, back pain and myalgias.  Skin: Negative.   Allergic/Immunologic: Negative.   Neurological: Negative.   Hematological: Negative.   Psychiatric/Behavioral: Negative.   All other systems reviewed and are negative.      Objective:   Physical Exam  Constitutional: He is oriented to  person, place, and time. He appears well-developed and well-nourished.  HENT:  Head: Normocephalic and atraumatic.  Neck: Normal range of motion. Neck supple.  Cardiovascular: Normal rate and regular rhythm.  Pulmonary/Chest: Effort normal and breath sounds normal.  Musculoskeletal:  Normal Muscle Bulk and Muscle Testing Reveals: Upper Extremities: Full ROM and Muscle Strength 5/5 Back without spinal tenderness noted Lower Extremities: Full ROM and Muscle Strength 5/5 Arises from Table with ease Narrow Based Gait  Neurological: He is alert and oriented to person, place, and time.  Skin: Skin is warm and dry.  Psychiatric: He has a normal mood and affect.  Nursing note and vitals reviewed.         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 We will continue the opioid monitoring program, tis consists of regular clinic visits, examinations, urine drug screen, pill counts as well as use of [place] Controlled Substance Reporting System.  Continue with exercise regime. 2. Lumbar Radiculopathy: Continue Gabapentin prescribed by oncologist. 03/29/2018.  20 minutes of face to face patient care time was spent during this visit. All questions were encouraged and answered.  F/U in 1 month

## 2018-04-25 ENCOUNTER — Encounter: Payer: Medicare PPO | Attending: Physical Medicine & Rehabilitation

## 2018-04-25 ENCOUNTER — Encounter: Payer: Self-pay | Admitting: Physical Medicine & Rehabilitation

## 2018-04-25 ENCOUNTER — Ambulatory Visit: Payer: Medicare PPO | Admitting: Physical Medicine & Rehabilitation

## 2018-04-25 VITALS — BP 158/78 | HR 81 | Resp 16 | Ht 75.0 in | Wt 224.0 lb

## 2018-04-25 DIAGNOSIS — T451X5A Adverse effect of antineoplastic and immunosuppressive drugs, initial encounter: Secondary | ICD-10-CM

## 2018-04-25 DIAGNOSIS — Z5181 Encounter for therapeutic drug level monitoring: Secondary | ICD-10-CM | POA: Diagnosis present

## 2018-04-25 DIAGNOSIS — G894 Chronic pain syndrome: Secondary | ICD-10-CM | POA: Insufficient documentation

## 2018-04-25 DIAGNOSIS — M961 Postlaminectomy syndrome, not elsewhere classified: Secondary | ICD-10-CM | POA: Diagnosis not present

## 2018-04-25 DIAGNOSIS — Z79899 Other long term (current) drug therapy: Secondary | ICD-10-CM | POA: Diagnosis not present

## 2018-04-25 DIAGNOSIS — G620 Drug-induced polyneuropathy: Secondary | ICD-10-CM | POA: Diagnosis not present

## 2018-04-25 MED ORDER — OXYCODONE HCL 10 MG PO TABS: 10.0000 mg | ORAL_TABLET | Freq: Four times a day (QID) | ORAL | 0 refills | Status: DC | PRN

## 2018-04-25 NOTE — Progress Notes (Signed)
Subjective:    Patient ID: Logan Briggs, male    DOB: Sep 17, 1956, 62 y.o.   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   The patient states that his primary new complaint is numbness in his feet.  He tried low-dose gabapentin for this but was not effective.  We discussed that gabapentin or Lyrica are helpful for neuropathic pain but not necessarily for the symptoms of numbness. He does have a neurology appointment tomorrow. Pain Inventory Average Pain 5 Pain Right Now 5 My pain is sharp and stabbing  In the last 24 hours, has pain interfered with the following? General activity 6 Relation with others 6 Enjoyment of life 7 What TIME of day is your pain at its worst? evening,night Sleep (in general) Good  Pain is worse with: some activites Pain improves with: medication Relief from Meds: 7  Mobility ability to climb steps?  yes do you drive?  yes  Function Do you have any goals in this area?  no  Neuro/Psych No problems in this area  Prior Studies Any changes since last visit?  no  Physicians involved in your care Any changes since last visit?  no   History reviewed. No pertinent family history. Social History   Socioeconomic History  . Marital status: Married    Spouse name: Not on file  . Number of children: Not on file  . Years of education: Not on file  . Highest education level: Not on file  Occupational History  . Not on file  Social Needs  . Financial resource strain: Not on file  . Food insecurity:    Worry: Not on file    Inability: Not on file  . Transportation needs:    Medical: Not on file    Non-medical: Not on file  Tobacco Use  . Smoking status: Former Smoker    Packs/day: 0.00    Years: 20.00    Pack years: 0.00    Types:  Cigarettes  . Smokeless tobacco: Never Used  . Tobacco comment: desires to quit but starts again when upset or stressed  Substance and Sexual Activity  . Alcohol use: Yes    Comment: only on holidays, beer  . Drug use: No  . Sexual activity: Never  Lifestyle  . Physical activity:    Days per week: Not on file    Minutes per session: Not on file  . Stress: Not on file  Relationships  . Social connections:    Talks on phone: Not on file    Gets together: Not on file    Attends religious service: Not on file    Active member of club or organization: Not on file    Attends meetings of clubs or organizations: Not on file    Relationship status: Not on file  Other Topics Concern  . Not on file  Social History Narrative  . Not on file   Past Surgical History:  Procedure Laterality Date  . BACK SURGERY     Past Medical History:  Diagnosis Date  . Chronic back pain   . Glaucoma    BP (!) 158/78 (BP Location: Left Arm, Patient Position: Sitting, Cuff Size: Normal)   Pulse 81   Resp 16   Ht 6\' 3"  (  1.905 m)   Wt 224 lb (101.6 kg)   SpO2 96%   BMI 28.00 kg/m   Opioid Risk Score:   Fall Risk Score:  `1  Depression screen PHQ 2/9  Depression screen [facility] 2/9 02/21/2018 12/05/2017 08/26/2017 05/27/2017 03/29/2017 03/01/2017 01/05/2017  Decreased Interest 0 0 0 0 0 0 0  Down, Depressed, Hopeless 0 0 0 0 0 0 0  PHQ - 2 Score 0 0 0 0 0 0 0  Altered sleeping - - - - - - -  Tired, decreased energy - - - - - - -  Change in appetite - - - - - - -  Feeling bad or failure about yourself  - - - - - - -  Trouble concentrating - - - - - - -  Moving slowly or fidgety/restless - - - - - - -  Suicidal thoughts - - - - - - -  PHQ-9 Score - - - - - - -    Review of Systems  Constitutional: Negative.   HENT: Negative.   Eyes: Negative.   Respiratory: Negative.   Cardiovascular: Negative.   Gastrointestinal: Negative.   Endocrine: Negative.   Genitourinary: Negative.   Musculoskeletal:  Positive for arthralgias and myalgias.  Skin: Negative.   Allergic/Immunologic: Negative.   Neurological: Negative.   Hematological: Negative.   Psychiatric/Behavioral: Negative.        Objective:   Physical Exam  Constitutional: He is oriented to person, place, and time. He appears well-developed and well-nourished. No distress.  HENT:  Head: Normocephalic and atraumatic.  Eyes: EOM are normal.  Neurological: He is alert and oriented to person, place, and time.  Reduced sensation left L4-L5 dermatomal distribution pinprick.  No stocking glove numbness noted on pinprick examination.  He is a will to identify light touch on his feet.  He has no complaints in his hands.  Pinprick and light touch examination of both hands is normal. Negative straight leg raising His strength is 5/5 bilateral deltoid bicep tricep grip hip flexion extension ankle dorsiflexion   Skin: He is not diaphoretic.  Nursing note and vitals reviewed.         Assessment & Plan:  1.  Complaints of numbness but no dense sensory abnormalities noted on pinprick or light touch examination.  We discussed that the neuropathic pain medications do not help with symptoms of numbness.  He has tried gabapentin in the past for chronic radicular pain which is not very effective.  Review of PMP aware website performed today reveals that he was also trialed on Lyrica 75 mill grams twice a day although he does not recall the effect of this medication.  2.  Chronic radiculopathy nonresponsive to atypical anticonvulsants treatment.  He has been maintained on oxycodone 10 mg 4 times daily.  No dosage escalation during his cancer treatment.  3.  Debility secondary to his lung cancer and treatment.  We discussed that he may benefit from a round of home health or outpatient therapy.  He like to hold off on this until he talks to the neurologist tomorrow. Nurse practitioner visit 1 month  We discussed his exercise regimen which is  essentially nothing at this time.  We will give him sit to stand exercises these were printed out.  PMP aware website reviewed no red flags Urine drug screen reviewed, consistent on 01/03/2018 Opioid consent and controlled substance agreement to be reviewed next month  Over half of the 25 min visit was spent counseling  and coordinating care.

## 2018-04-25 NOTE — Patient Instructions (Signed)
Sit-to-Stand Exercise The sit-to-stand exercise (also known as the chair stand or chair rise exercise) strengthens your lower body and helps you maintain or improve your mobility and independence. The goal is to do the sit-to-stand exercise without using your hands. This will be easier as you become stronger. You should always talk with your health care provider before starting any exercise program, especially if you have had recent surgery. Do the exercise exactly as told by your health care provider and adjust it as directed. It is normal to feel mild stretching, pulling, tightness, or discomfort as you do this exercise, but you should stop right away if you feel sudden pain or your pain gets worse. Do not begin doing this exercise until told by your health care provider. What the sit-to-stand exercise does The sit-to-stand exercise helps to strengthen the muscles in your thighs and the muscles in the center of your body that give you stability (core muscles). This exercise is especially helpful if:  You have had knee or hip surgery.  You have trouble getting up from a chair, out of a car, or off the toilet.  How to do the sit-to-stand exercise 1. Sit toward the front edge of a sturdy chair without armrests. Your knees should be bent and your feet should be flat on the floor and shoulder-width apart. 2. Place your hands lightly on each side of the seat. Keep your back and neck as straight as possible, with your chest slightly forward. 3. Breathe in slowly. Lean forward and slightly shift your weight to the front of your feet. 4. Breathe out as you slowly stand up. Use your hands as little as possible. 5. Stand and pause for a full breath in and out. 6. Breathe in as you sit down slowly. Tighten your core and abdominal muscles to control your lowering as much as possible. 7. Breathe out slowly. 8. Do this exercise 10-15 times. If needed, do it fewer times until you build up strength. 9. Rest for  1 minute, then do another set of 10-15 repetitions. To change the difficulty of the sit-to-stand exercise  If the exercise is too difficult, use a chair with sturdy armrests, and push off the armrests to help you come to the standing position. You can also use the armrests to help slowly lower yourself back to sitting. As this gets easier, try to use your arms less. You can also place a firm cushion or pillow on the chair to make the surface higher.  If this exercise is too easy, do not use your arms to help raise or lower yourself. You can also wear a weighted vest, use hand weights, increase your repetitions, or try a lower chair. General tips  You may feel tired when starting an exercise routine. This is normal.  You may have muscle soreness that lasts a few days. This is normal. As you get stronger, you may not feel muscle soreness.  Use smooth, steady movements.  Do not  hold your breath during strength exercises. This can cause unsafe changes in your blood pressure.  Breathe in slowly through your nose, and breathe out slowly through your mouth. Summary  Strengthening your lower body is an important step to help you move safely and independently.  The sit-to-stand exercise helps strengthen the muscles in your thighs and core.  You should always talk with your health care provider before starting any exercise program, especially if you have had recent surgery. This information is not intended to replace  advice given to you by your health care provider. Make sure you discuss any questions you have with your health care provider. Document Released: 12/16/2016 Document Revised: 12/16/2016 Document Reviewed: 12/16/2016 Elsevier Interactive Patient Education  2018 [employer].

## 2018-05-23 ENCOUNTER — Encounter: Payer: Self-pay | Admitting: Registered Nurse

## 2018-05-23 ENCOUNTER — Encounter: Payer: Medicare PPO | Attending: Physical Medicine & Rehabilitation | Admitting: Registered Nurse

## 2018-05-23 VITALS — BP 119/78 | HR 77 | Resp 16 | Ht 76.0 in | Wt 233.0 lb

## 2018-05-23 DIAGNOSIS — Z79899 Other long term (current) drug therapy: Secondary | ICD-10-CM

## 2018-05-23 DIAGNOSIS — Z5181 Encounter for therapeutic drug level monitoring: Secondary | ICD-10-CM

## 2018-05-23 DIAGNOSIS — G894 Chronic pain syndrome: Secondary | ICD-10-CM | POA: Diagnosis not present

## 2018-05-23 DIAGNOSIS — M5416 Radiculopathy, lumbar region: Secondary | ICD-10-CM

## 2018-05-23 DIAGNOSIS — M961 Postlaminectomy syndrome, not elsewhere classified: Secondary | ICD-10-CM

## 2018-05-23 DIAGNOSIS — R5381 Other malaise: Secondary | ICD-10-CM

## 2018-05-23 DIAGNOSIS — T451X5A Adverse effect of antineoplastic and immunosuppressive drugs, initial encounter: Secondary | ICD-10-CM | POA: Diagnosis not present

## 2018-05-23 DIAGNOSIS — G620 Drug-induced polyneuropathy: Secondary | ICD-10-CM

## 2018-05-23 MED ORDER — OXYCODONE HCL 10 MG PO TABS: 10.0000 mg | ORAL_TABLET | Freq: Four times a day (QID) | ORAL | 0 refills | Status: DC | PRN

## 2018-05-23 NOTE — Progress Notes (Signed)
Subjective:    Patient ID: Logan Briggs, male    DOB: 1956-07-24, 62 y.o.   MRN: [id number]  HPI: [name] is a 62 year old male who returns for follow up appointment for chronic pain and medication refill. He states his pain is located in his lower back radiating into his left lower extremity. Also reports increase intensity of neuropathic pain he has been prescribed gabapentin and Lyrica in the past and it was effective, explained to [name] he may have needed dosage increased. He states he has a an appointment scheduled with neurologist and will await their recommendations. He rates his pain 7. His current exercise regime is walking.   Also reports good appetite, admits to weakness and refuses physical therapy at this time. Will continue to monitor.   [name] Morphine Equivalent is 60.00 MME. Last UDS was Performed on 01/03/2018, it was consistent.    Pain Inventory Average Pain 7 Pain Right Now 7 My pain is sharp and stabbing  In the last 24 hours, has pain interfered with the following? General activity 5 Relation with others 5 Enjoyment of life 6 What TIME of day is your pain at its worst? evening, night Sleep (in general) Good  Pain is worse with: some activites Pain improves with: heat/ice and medication Relief from Meds: 7  Mobility ability to climb steps?  yes do you drive?  yes Do you have any goals in this area?  no  Function Do you have any goals in this area?  no  Neuro/Psych No problems in this area  Prior Studies Any changes since last visit?  no  Physicians involved in your care Any changes since last visit?  no   No family history on file. Social History   Socioeconomic History  . Marital status: Married    Spouse name: Not on file  . Number of children: Not on file  . Years of education: Not on file  . Highest education level: Not on file  Occupational History  . Not on file  Social Needs  . Financial resource  strain: Not on file  . Food insecurity:    Worry: Not on file    Inability: Not on file  . Transportation needs:    Medical: Not on file    Non-medical: Not on file  Tobacco Use  . Smoking status: Former Smoker    Packs/day: 0.00    Years: 20.00    Pack years: 0.00    Types: Cigarettes  . Smokeless tobacco: Never Used  . Tobacco comment: desires to quit but starts again when upset or stressed  Substance and Sexual Activity  . Alcohol use: Yes    Comment: only on holidays, beer  . Drug use: No  . Sexual activity: Never  Lifestyle  . Physical activity:    Days per week: Not on file    Minutes per session: Not on file  . Stress: Not on file  Relationships  . Social connections:    Talks on phone: Not on file    Gets together: Not on file    Attends religious service: Not on file    Active member of club or organization: Not on file    Attends meetings of clubs or organizations: Not on file    Relationship status: Not on file  Other Topics Concern  . Not on file  Social History Narrative  . Not on file   Past Surgical History:  Procedure Laterality  Date  . BACK SURGERY     Past Medical History:  Diagnosis Date  . Chronic back pain   . Glaucoma    BP 119/78 (BP Location: Left Arm, Patient Position: Sitting, Cuff Size: Large)   Pulse 77   Resp 16   Ht 6\' 4"  (1.93 m)   Wt 233 lb (105.7 kg)   SpO2 98%   BMI 28.36 kg/m   Opioid Risk Score:   Fall Risk Score:  `1  Depression screen PHQ 2/9  Depression screen [facility] 2/9 02/21/2018 12/05/2017 08/26/2017 05/27/2017 03/29/2017 03/01/2017 01/05/2017  Decreased Interest 0 0 0 0 0 0 0  Down, Depressed, Hopeless 0 0 0 0 0 0 0  PHQ - 2 Score 0 0 0 0 0 0 0  Altered sleeping - - - - - - -  Tired, decreased energy - - - - - - -  Change in appetite - - - - - - -  Feeling bad or failure about yourself  - - - - - - -  Trouble concentrating - - - - - - -  Moving slowly or fidgety/restless - - - - - - -  Suicidal thoughts - - - - -  - -  PHQ-9 Score - - - - - - -   Review of Systems  Constitutional: Negative.   HENT: Negative.   Eyes: Negative.   Respiratory: Negative.   Cardiovascular: Negative.   Gastrointestinal: Negative.   Endocrine: Negative.   Genitourinary: Negative.   Musculoskeletal: Positive for arthralgias and myalgias.  Skin: Negative.   Allergic/Immunologic: Negative.   Neurological: Negative.   Hematological: Negative.   Psychiatric/Behavioral: Negative.   All other systems reviewed and are negative.      Objective:   Physical Exam  Constitutional: He is oriented to person, place, and time. He appears well-developed and well-nourished.  HENT:  Head: Normocephalic and atraumatic.  Neck: Normal range of motion. Neck supple.  Cardiovascular: Normal rate and regular rhythm.  Pulmonary/Chest: Effort normal and breath sounds normal.  Musculoskeletal:  Normal Muscle Bulk and Muscle Testing Reveals: Upper Extremities: Full ROM and Muscle Strength 5/5 Back without spinal tenderness noted Lower Extremities: Full ROM and Muscle Strength 5/5 Arises from Table with Ease  Narrow Based gait  Neurological: He is alert and oriented to person, place, and time.  Skin: Skin is warm and dry.  Psychiatric: He has a normal mood and affect. His behavior is normal.  Nursing note and vitals reviewed.         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 We will continue the opioid monitoring program, tis consists of regular clinic visits, examinations, urine drug screen, pill counts as well as use of [place] Controlled Substance Reporting System.  Continue with exercise regime. 2. Lumbar Radiculopathy/ Chronic Radiculopathy:non-responsive to Gabapentin or Lyrica medication regime in the past: Awaiting Neurologist input. 05/23/2018. 3. Debility secondary to his lung cancer: He  refuses physical therapy at this time.  20 minutes of face to face patient care time was spent during this visit. All questions were encouraged and answered.  F/U in 1 month

## 2018-06-27 ENCOUNTER — Encounter: Payer: Self-pay | Admitting: Registered Nurse

## 2018-06-27 ENCOUNTER — Encounter: Payer: Medicare PPO | Attending: Physical Medicine & Rehabilitation | Admitting: Registered Nurse

## 2018-06-27 VITALS — BP 132/78 | HR 69 | Ht 76.0 in | Wt 236.0 lb

## 2018-06-27 DIAGNOSIS — Z79899 Other long term (current) drug therapy: Secondary | ICD-10-CM | POA: Insufficient documentation

## 2018-06-27 DIAGNOSIS — Z5181 Encounter for therapeutic drug level monitoring: Secondary | ICD-10-CM | POA: Diagnosis present

## 2018-06-27 DIAGNOSIS — G894 Chronic pain syndrome: Secondary | ICD-10-CM | POA: Insufficient documentation

## 2018-06-27 DIAGNOSIS — M5416 Radiculopathy, lumbar region: Secondary | ICD-10-CM | POA: Diagnosis not present

## 2018-06-27 DIAGNOSIS — M961 Postlaminectomy syndrome, not elsewhere classified: Secondary | ICD-10-CM

## 2018-06-27 DIAGNOSIS — T451X5A Adverse effect of antineoplastic and immunosuppressive drugs, initial encounter: Secondary | ICD-10-CM

## 2018-06-27 DIAGNOSIS — G620 Drug-induced polyneuropathy: Secondary | ICD-10-CM

## 2018-06-27 MED ORDER — OXYCODONE HCL 10 MG PO TABS: 10.0000 mg | ORAL_TABLET | Freq: Four times a day (QID) | ORAL | 0 refills | Status: DC | PRN

## 2018-06-27 NOTE — Progress Notes (Signed)
Subjective:    Patient ID: Logan Briggs, male    DOB: Mar 22, 1956, 62 y.o.   MRN: [id number]  HPI: Logan Briggs is a 62 year old male who returns for follow up appointment for chronic pain and medication refill. He states his pain is located in his lower back radiating into his left lower extremity and bilateral feet with numbness and tingling. He rates his pain 7. His current exercise regime is walking.   Logan Briggs Morphine Equivalent is 60.00 MME. Last UDS was Performed on 01/03/2018, it was consistent. Oral Swab ordered today.   Pain Inventory Average Pain 7 Pain Right Now 7 My pain is sharp and stabbing  In the last 24 hours, has pain interfered with the following? General activity 5 Relation with others 5 Enjoyment of life 6 What TIME of day is your pain at its worst? night Sleep (in general) Good  Pain is worse with: some activites Pain improves with: heat/ice Relief from Meds: 7  Mobility ability to climb steps?  yes do you drive?  yes  Function Do you have any goals in this area?  no  Neuro/Psych No problems in this area  Prior Studies Any changes since last visit?  no  Physicians involved in your care Any changes since last visit?  no   No family history on file. Social History   Socioeconomic History  . Marital status: Married    Spouse name: Not on file  . Number of children: Not on file  . Years of education: Not on file  . Highest education level: Not on file  Occupational History  . Not on file  Social Needs  . Financial resource strain: Not on file  . Food insecurity:    Worry: Not on file    Inability: Not on file  . Transportation needs:    Medical: Not on file    Non-medical: Not on file  Tobacco Use  . Smoking status: Former Smoker    Packs/day: 0.00    Years: 20.00    Pack years: 0.00    Types: Cigarettes  . Smokeless tobacco: Never Used  . Tobacco comment: desires to quit but starts again when upset or stressed    Substance and Sexual Activity  . Alcohol use: Yes    Comment: only on holidays, beer  . Drug use: No  . Sexual activity: Never  Lifestyle  . Physical activity:    Days per week: Not on file    Minutes per session: Not on file  . Stress: Not on file  Relationships  . Social connections:    Talks on phone: Not on file    Gets together: Not on file    Attends religious service: Not on file    Active member of club or organization: Not on file    Attends meetings of clubs or organizations: Not on file    Relationship status: Not on file  Other Topics Concern  . Not on file  Social History Narrative  . Not on file   Past Surgical History:  Procedure Laterality Date  . BACK SURGERY     Past Medical History:  Diagnosis Date  . Chronic back pain   . Glaucoma    There were no vitals taken for this visit.  Opioid Risk Score:   Fall Risk Score:  `1  Depression screen PHQ 2/9  Depression screen [facility] 2/9 02/21/2018 12/05/2017 08/26/2017 05/27/2017 03/29/2017 03/01/2017 01/05/2017  Decreased Interest 0 0  0 0 0 0 0  Down, Depressed, Hopeless 0 0 0 0 0 0 0  PHQ - 2 Score 0 0 0 0 0 0 0  Altered sleeping - - - - - - -  Tired, decreased energy - - - - - - -  Change in appetite - - - - - - -  Feeling bad or failure about yourself  - - - - - - -  Trouble concentrating - - - - - - -  Moving slowly or fidgety/restless - - - - - - -  Suicidal thoughts - - - - - - -  PHQ-9 Score - - - - - - -     Review of Systems  Constitutional: Negative.   HENT: Negative.   Eyes: Negative.   Respiratory: Negative.   Cardiovascular: Negative.   Gastrointestinal: Negative.   Endocrine: Negative.   Genitourinary: Negative.   Musculoskeletal: Positive for arthralgias, back pain and myalgias.  Skin: Negative.   Allergic/Immunologic: Negative.   Neurological: Negative.   Hematological: Negative.   Psychiatric/Behavioral: Negative.   All other systems reviewed and are negative.      Objective:    Physical Exam  Constitutional: He is oriented to person, place, and time. He appears well-developed and well-nourished.  HENT:  Head: Normocephalic and atraumatic.  Neck: Normal range of motion. Neck supple.  Cardiovascular: Normal rate and regular rhythm.  Pulmonary/Chest: Effort normal and breath sounds normal.  Musculoskeletal:  Normal Muscle Bulk and Muscle Testing Reveals: Upper Extremities: Full ROM and Muscle Strength 5/5 Back without spinal tenderness noted Lower Extremities: Full ROM and Muscle Strength 5/5 Arises from Table with ease Narrow Based Gait  Neurological: He is alert and oriented to person, place, and time.  Skin: Skin is warm and dry.  Psychiatric: He has a normal mood and affect. His behavior is normal.  Nursing note and vitals reviewed.         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 We will continue the opioid monitoring program, tis consists of regular clinic visits, examinations, urine drug screen, pill counts as well as use of [place] Controlled Substance Reporting System.  Continue with exercise regime. 2. 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 3. Debility secondary to his lung cancer: Reports good appetite, has gained weight. Continue to monitor.   20 minutes of face to face patient care time was spent during this visit. All questions were encouraged and answered.  F/U in 1 month

## 2018-06-30 LAB — DRUG TOX MONITOR 1 W/CONF, ORAL FLD
Amphetamines: NEGATIVE ng/mL (ref <10)
BUPRENORPHINE: NEGATIVE ng/mL (ref <0.10)
Barbiturates: NEGATIVE ng/mL (ref <10)
Benzodiazepines: NEGATIVE ng/mL (ref <0.50)
COCAINE: NEGATIVE ng/mL (ref <5.0)
Codeine: NEGATIVE ng/mL (ref <2.5)
Dihydrocodeine: NEGATIVE ng/mL (ref <2.5)
Fentanyl: NEGATIVE ng/mL (ref <0.10)
HEROIN METABOLITE: NEGATIVE ng/mL (ref <1.0)
Hydrocodone: NEGATIVE ng/mL (ref <2.5)
Hydromorphone: NEGATIVE ng/mL (ref <2.5)
MARIJUANA: NEGATIVE ng/mL (ref <2.5)
MDMA: NEGATIVE ng/mL (ref <10)
MEPROBAMATE: NEGATIVE ng/mL (ref <2.5)
METHADONE: NEGATIVE ng/mL (ref <5.0)
MORPHINE: NEGATIVE ng/mL (ref <2.5)
NORHYDROCODONE: NEGATIVE ng/mL (ref <2.5)
NOROXYCODONE: 2.6 ng/mL — AB (ref <2.5)
Nicotine Metabolite: NEGATIVE ng/mL (ref <5.0)
OXYCODONE: 23.5 ng/mL — AB (ref <2.5)
Opiates: POSITIVE ng/mL — AB (ref <2.5)
Oxymorphone: NEGATIVE ng/mL (ref <2.5)
Phencyclidine: NEGATIVE ng/mL (ref <10)
Tapentadol: NEGATIVE ng/mL (ref <5.0)
Tramadol: NEGATIVE ng/mL (ref <5.0)
Zolpidem: NEGATIVE ng/mL (ref <5.0)

## 2018-06-30 LAB — DRUG TOX ALC METAB W/CON, ORAL FLD: Alcohol Metabolite: NEGATIVE ng/mL (ref <25)

## 2018-07-04 ENCOUNTER — Telehealth: Payer: Self-pay | Admitting: *Deleted

## 2018-07-04 NOTE — Telephone Encounter (Signed)
Oral swab drug screen was consistent for prescribed medications.  ?

## 2018-07-28 ENCOUNTER — Encounter: Payer: Medicare PPO | Attending: Physical Medicine & Rehabilitation | Admitting: Registered Nurse

## 2018-07-28 ENCOUNTER — Encounter: Payer: Self-pay | Admitting: Registered Nurse

## 2018-07-28 VITALS — BP 129/84 | HR 78 | Resp 14 | Ht 76.0 in | Wt 240.0 lb

## 2018-07-28 DIAGNOSIS — G620 Drug-induced polyneuropathy: Secondary | ICD-10-CM

## 2018-07-28 DIAGNOSIS — Z79899 Other long term (current) drug therapy: Secondary | ICD-10-CM | POA: Insufficient documentation

## 2018-07-28 DIAGNOSIS — G894 Chronic pain syndrome: Secondary | ICD-10-CM | POA: Insufficient documentation

## 2018-07-28 DIAGNOSIS — M62838 Other muscle spasm: Secondary | ICD-10-CM | POA: Diagnosis not present

## 2018-07-28 DIAGNOSIS — Z5181 Encounter for therapeutic drug level monitoring: Secondary | ICD-10-CM | POA: Diagnosis present

## 2018-07-28 DIAGNOSIS — T451X5A Adverse effect of antineoplastic and immunosuppressive drugs, initial encounter: Secondary | ICD-10-CM

## 2018-07-28 DIAGNOSIS — M961 Postlaminectomy syndrome, not elsewhere classified: Secondary | ICD-10-CM | POA: Diagnosis not present

## 2018-07-28 DIAGNOSIS — M5416 Radiculopathy, lumbar region: Secondary | ICD-10-CM | POA: Diagnosis not present

## 2018-07-28 MED ORDER — METHOCARBAMOL 500 MG PO TABS: 500.0000 mg | ORAL_TABLET | Freq: Two times a day (BID) | ORAL | 2 refills | Status: AC | PRN

## 2018-07-28 MED ORDER — OXYCODONE HCL 10 MG PO TABS: 10.0000 mg | ORAL_TABLET | Freq: Four times a day (QID) | ORAL | 0 refills | Status: DC | PRN

## 2018-07-28 NOTE — Progress Notes (Signed)
Subjective:    Patient ID: Logan Briggs, male    DOB: 1956-07-22, 62 y.o.   MRN: [id number]  HPI: [name] is a 62 year old male who returns for follow up appointment for chronic pain and medication refill. He states his pain is located in his lower back radiating into his left lower extremity. He rates his pain 7. His current exercise regime is walking.   Logan Briggs Morphine Equivalent is 60.00 MME. Last Oral Swab was Performed on 06/27/2018, it was consistent.    Pain Inventory Average Pain 7 Pain Right Now 7 My pain is sharp and stabbing  In the last 24 hours, has pain interfered with the following? General activity 5 Relation with others 5 Enjoyment of life 6 What TIME of day is your pain at its worst? evening, night Sleep (in general) Poor  Pain is worse with: some activites Pain improves with: heat/ice and medication Relief from Meds: 7  Mobility walk without assistance ability to climb steps?  yes do you drive?  yes Do you have any goals in this area?  no  Function Do you have any goals in this area?  no  Neuro/Psych weakness numbness spasms  Prior Studies Any changes since last visit?  no  Physicians involved in your care Any changes since last visit?  no   History reviewed. No pertinent family history. Social History   Socioeconomic History  . Marital status: Married    Spouse name: Not on file  . Number of children: Not on file  . Years of education: Not on file  . Highest education level: Not on file  Occupational History  . Not on file  Social Needs  . Financial resource strain: Not on file  . Food insecurity:    Worry: Not on file    Inability: Not on file  . Transportation needs:    Medical: Not on file    Non-medical: Not on file  Tobacco Use  . Smoking status: Former Smoker    Packs/day: 0.00    Years: 20.00    Pack years: 0.00    Types: Cigarettes  . Smokeless tobacco: Never Used  . Tobacco comment: desires to  quit but starts again when upset or stressed  Substance and Sexual Activity  . Alcohol use: Yes    Comment: only on holidays, beer  . Drug use: No  . Sexual activity: Never  Lifestyle  . Physical activity:    Days per week: Not on file    Minutes per session: Not on file  . Stress: Not on file  Relationships  . Social connections:    Talks on phone: Not on file    Gets together: Not on file    Attends religious service: Not on file    Active member of club or organization: Not on file    Attends meetings of clubs or organizations: Not on file    Relationship status: Not on file  Other Topics Concern  . Not on file  Social History Narrative  . Not on file   Past Surgical History:  Procedure Laterality Date  . BACK SURGERY     Past Medical History:  Diagnosis Date  . Chronic back pain   . Glaucoma    BP 129/84   Pulse 78   Resp 14   Ht 6\' 4"  (1.93 m)   Wt 240 lb (108.9 kg)   SpO2 94%   BMI 29.21 kg/m   Opioid  Risk Score:   Fall Risk Score:  `1  Depression screen PHQ 2/9  Depression screen [facility] 2/9 02/21/2018 12/05/2017 08/26/2017 05/27/2017 03/29/2017 03/01/2017 01/05/2017  Decreased Interest 0 0 0 0 0 0 0  Down, Depressed, Hopeless 0 0 0 0 0 0 0  PHQ - 2 Score 0 0 0 0 0 0 0  Altered sleeping - - - - - - -  Tired, decreased energy - - - - - - -  Change in appetite - - - - - - -  Feeling bad or failure about yourself  - - - - - - -  Trouble concentrating - - - - - - -  Moving slowly or fidgety/restless - - - - - - -  Suicidal thoughts - - - - - - -  PHQ-9 Score - - - - - - -    Review of Systems  Constitutional: Negative.   HENT: Negative.   Eyes: Negative.   Respiratory: Negative.   Cardiovascular: Negative.   Gastrointestinal: Negative.   Endocrine: Negative.   Genitourinary: Negative.   Musculoskeletal: Negative.        Spasms   Allergic/Immunologic: Negative.   Neurological: Negative.        Tingling  Hematological: Negative.     Psychiatric/Behavioral: Negative.   All other systems reviewed and are negative.      Objective:   Physical Exam  Constitutional: He is oriented to person, place, and time. He appears well-developed and well-nourished.  HENT:  Head: Normocephalic and atraumatic.  Neck: Normal range of motion. Neck supple.  Cardiovascular: Normal rate and regular rhythm.  Pulmonary/Chest: Effort normal and breath sounds normal.  Musculoskeletal:  Normal Muscle Bulk and Muscle Testing Reveals: Upper Extremities: Full ROM and Muscle Strength 5/5 Back without spinal tenderness noted Lower Extremities: Full ROM and Muscle Strength 5/5 Arises from Table with ease Narrow Based Gait  Neurological: He is alert and oriented to person, place, and time.  Skin: Skin is warm and dry.  Psychiatric: He has a normal mood and affect. His behavior is normal.  Nursing note and vitals reviewed.         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 We will continue the opioid monitoring program, tis consists of regular clinic visits, examinations, urine drug screen, pill counts as well as use of [place] Controlled Substance Reporting System.  Continue with exercise regime. 2. 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 3. Debility secondary to his lung cancer: Reports good appetite, has gained weight. Continue to monitor. 07/28/2018  20 minutes of face to face patient care time was spent during this visit. All questions were encouraged and answered.  F/U in 1 month

## 2018-08-21 ENCOUNTER — Other Ambulatory Visit: Payer: Self-pay

## 2018-08-21 ENCOUNTER — Encounter: Payer: Self-pay | Admitting: Registered Nurse

## 2018-08-21 ENCOUNTER — Encounter: Payer: Medicare PPO | Attending: Physical Medicine & Rehabilitation | Admitting: Registered Nurse

## 2018-08-21 VITALS — BP 132/78 | HR 77 | Ht 76.0 in | Wt 245.2 lb

## 2018-08-21 DIAGNOSIS — M62838 Other muscle spasm: Secondary | ICD-10-CM | POA: Diagnosis not present

## 2018-08-21 DIAGNOSIS — G894 Chronic pain syndrome: Secondary | ICD-10-CM | POA: Diagnosis present

## 2018-08-21 DIAGNOSIS — M961 Postlaminectomy syndrome, not elsewhere classified: Secondary | ICD-10-CM

## 2018-08-21 DIAGNOSIS — M5416 Radiculopathy, lumbar region: Secondary | ICD-10-CM

## 2018-08-21 DIAGNOSIS — G620 Drug-induced polyneuropathy: Secondary | ICD-10-CM | POA: Diagnosis not present

## 2018-08-21 DIAGNOSIS — Z5181 Encounter for therapeutic drug level monitoring: Secondary | ICD-10-CM | POA: Diagnosis present

## 2018-08-21 DIAGNOSIS — T451X5A Adverse effect of antineoplastic and immunosuppressive drugs, initial encounter: Secondary | ICD-10-CM

## 2018-08-21 DIAGNOSIS — Z79899 Other long term (current) drug therapy: Secondary | ICD-10-CM | POA: Diagnosis present

## 2018-08-21 MED ORDER — OXYCODONE HCL 10 MG PO TABS: 10.0000 mg | ORAL_TABLET | Freq: Four times a day (QID) | ORAL | 0 refills | Status: DC | PRN

## 2018-08-21 NOTE — Progress Notes (Signed)
Subjective:    Patient ID: Logan Briggs, male    DOB: 1956/09/08, 62 y.o.   MRN: [id number]  HPI: [name] is a 62 year old male who returns for follow up appointment for chronic pain and medication refill. He states his pain is located in his lower back radiating into his left lower extremity. He rates his pain 7. His current exercise regime walking.   Mr. Rodman Morphine Equivalent is 60.00 MME. Last Oral Swab was Performed 06/27/2018, it was consistent.   Pain Inventory Average Pain 7 Pain Right Now 7 My pain is sharp and stabbing  In the last 24 hours, has pain interfered with the following? General activity 5 Relation with others 5 Enjoyment of life 6 What TIME of day is your pain at its worst? evening night Sleep (in general) Good  Pain is worse with: some activites Pain improves with: heat/ice and medication Relief from Meds: 7  Mobility ability to climb steps?  yes do you drive?  yes  Function Do you have any goals in this area?  no  Neuro/Psych No problems in this area  Prior Studies Any changes since last visit?  no  Physicians involved in your care Any changes since last visit?  no   No family history on file. Social History   Socioeconomic History  . Marital status: Married    Spouse name: Not on file  . Number of children: Not on file  . Years of education: Not on file  . Highest education level: Not on file  Occupational History  . Not on file  Social Needs  . Financial resource strain: Not on file  . Food insecurity:    Worry: Not on file    Inability: Not on file  . Transportation needs:    Medical: Not on file    Non-medical: Not on file  Tobacco Use  . Smoking status: Former Smoker    Packs/day: 0.00    Years: 20.00    Pack years: 0.00    Types: Cigarettes  . Smokeless tobacco: Never Used  . Tobacco comment: desires to quit but starts again when upset or stressed  Substance and Sexual Activity  . Alcohol use:  Yes    Comment: only on holidays, beer  . Drug use: No  . Sexual activity: Never  Lifestyle  . Physical activity:    Days per week: Not on file    Minutes per session: Not on file  . Stress: Not on file  Relationships  . Social connections:    Talks on phone: Not on file    Gets together: Not on file    Attends religious service: Not on file    Active member of club or organization: Not on file    Attends meetings of clubs or organizations: Not on file    Relationship status: Not on file  Other Topics Concern  . Not on file  Social History Narrative  . Not on file   Past Surgical History:  Procedure Laterality Date  . BACK SURGERY     Past Medical History:  Diagnosis Date  . Chronic back pain   . Glaucoma    BP 132/78   Pulse 77   Ht 6\' 4"  (1.93 m)   Wt 245 lb 3.2 oz (111.2 kg)   SpO2 97%   BMI 29.85 kg/m   Opioid Risk Score:   Fall Risk Score:  `1  Depression screen PHQ 2/9  Depression screen  [facility] 2/9 08/21/2018 02/21/2018 12/05/2017 08/26/2017 05/27/2017 03/29/2017 03/01/2017  Decreased Interest 0 0 0 0 0 0 0  Down, Depressed, Hopeless 0 0 0 0 0 0 0  PHQ - 2 Score 0 0 0 0 0 0 0  Altered sleeping - - - - - - -  Tired, decreased energy - - - - - - -  Change in appetite - - - - - - -  Feeling bad or failure about yourself  - - - - - - -  Trouble concentrating - - - - - - -  Moving slowly or fidgety/restless - - - - - - -  Suicidal thoughts - - - - - - -  PHQ-9 Score - - - - - - -    Review of Systems  Constitutional: Negative.   HENT: Negative.   Eyes: Negative.   Respiratory: Negative.   Cardiovascular: Negative.   Gastrointestinal: Negative.   Endocrine: Negative.   Genitourinary: Negative.   Musculoskeletal: Negative.   Skin: Negative.   Allergic/Immunologic: Negative.   Neurological: Negative.   Hematological: Negative.   Psychiatric/Behavioral: Negative.   All other systems reviewed and are negative.      Objective:   Physical Exam    Constitutional: He appears well-developed and well-nourished.  HENT:  Head: Normocephalic and atraumatic.  Neck: Normal range of motion. Neck supple.  Cardiovascular: Normal rate and regular rhythm.  Pulmonary/Chest: Effort normal and breath sounds normal.  Musculoskeletal:  Normal Muscle Bulk and Muscle Testing Reveals: Upper Extremities: Full ROM and Muscle Strength 5/5 Back without spinal tenderness Lower Extremities: Full ROM and Muscle Strength 5/5 Arises from Table Slowly Narrow Based gait   Skin: Skin is warm and dry.  Nursing note and vitals reviewed.         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 We will continue the opioid monitoring program, tis consists of regular clinic visits, examinations, urine drug screen, pill counts as well as use of [place] Controlled Substance Reporting System.  Continue with exercise regime. 2. 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 3. Debility secondary to his lung cancer:Reports good appetite, has gained weight. Continue to monitor.08/21/2018 4. Muscle Spasm: Continue Methocarbamol. 08/21/2018.   20 minutes of face to face patient care time was spent during this visit. All questions were encouraged and answered.  F/U in 1 month

## 2018-09-28 ENCOUNTER — Encounter: Payer: Self-pay | Admitting: Physical Medicine & Rehabilitation

## 2018-09-28 ENCOUNTER — Ambulatory Visit: Payer: Medicare PPO | Admitting: Physical Medicine & Rehabilitation

## 2018-09-28 ENCOUNTER — Encounter: Payer: Medicare PPO | Attending: Physical Medicine & Rehabilitation

## 2018-09-28 VITALS — BP 116/71 | HR 94 | Ht 76.0 in | Wt 255.0 lb

## 2018-09-28 DIAGNOSIS — T451X5A Adverse effect of antineoplastic and immunosuppressive drugs, initial encounter: Secondary | ICD-10-CM | POA: Diagnosis not present

## 2018-09-28 DIAGNOSIS — M961 Postlaminectomy syndrome, not elsewhere classified: Secondary | ICD-10-CM

## 2018-09-28 DIAGNOSIS — Z79899 Other long term (current) drug therapy: Secondary | ICD-10-CM | POA: Insufficient documentation

## 2018-09-28 DIAGNOSIS — Z5181 Encounter for therapeutic drug level monitoring: Secondary | ICD-10-CM | POA: Diagnosis present

## 2018-09-28 DIAGNOSIS — G620 Drug-induced polyneuropathy: Secondary | ICD-10-CM | POA: Diagnosis not present

## 2018-09-28 DIAGNOSIS — G894 Chronic pain syndrome: Secondary | ICD-10-CM | POA: Diagnosis present

## 2018-09-28 NOTE — Patient Instructions (Signed)
Just call if you decide on therapy   Otherwise increase walking  5minutes outside 3 times a day  After 2 wks increaae to 10mg  3 times a day

## 2018-09-28 NOTE — Progress Notes (Signed)
Subjective:    Patient ID: Logan Briggs, male    DOB: 05/24/1956, 62 y.o.   MRN: [id number]  HPI  Finished Small cell lung Ca treatment , port a cath coming out Still has 96 tablets left, but taking ~4 times a day, last refill was late he had some overlap between prescriptions.  Does a little walking him a but nothing on a regular basis Still numb in hands and feet Mod I housework , dressing and bathing.  Fatigues easily. He did not have any physical therapy after his cancer treatment  Pain Inventory Average Pain 7 Pain Right Now 7 My pain is sharp and stabbing  In the last 24 hours, has pain interfered with the following? General activity 5 Relation with others 5 Enjoyment of life 6 What TIME of day is your pain at its worst? night Sleep (in general) Good  Pain is worse with: some activites Pain improves with: heat/ice and medication Relief from Meds: 7  Mobility ability to climb steps?  yes do you drive?  yes  Function Do you have any goals in this area?  no  Neuro/Psych No problems in this area  Prior Studies Any changes since last visit?  no  Physicians involved in your care Any changes since last visit?  no   No family history on file. Social History   Socioeconomic History  . Marital status: Married    Spouse name: Not on file  . Number of children: Not on file  . Years of education: Not on file  . Highest education level: Not on file  Occupational History  . Not on file  Social Needs  . Financial resource strain: Not on file  . Food insecurity:    Worry: Not on file    Inability: Not on file  . Transportation needs:    Medical: Not on file    Non-medical: Not on file  Tobacco Use  . Smoking status: Former Smoker    Packs/day: 0.00    Years: 20.00    Pack years: 0.00    Types: Cigarettes  . Smokeless tobacco: Never Used  . Tobacco comment: desires to quit but starts again when upset or stressed  Substance and Sexual Activity  .  Alcohol use: Yes    Comment: only on holidays, beer  . Drug use: No  . Sexual activity: Never  Lifestyle  . Physical activity:    Days per week: Not on file    Minutes per session: Not on file  . Stress: Not on file  Relationships  . Social connections:    Talks on phone: Not on file    Gets together: Not on file    Attends religious service: Not on file    Active member of club or organization: Not on file    Attends meetings of clubs or organizations: Not on file    Relationship status: Not on file  Other Topics Concern  . Not on file  Social History Narrative  . Not on file   Past Surgical History:  Procedure Laterality Date  . BACK SURGERY     Past Medical History:  Diagnosis Date  . Chronic back pain   . Glaucoma    There were no vitals taken for this visit.  Opioid Risk Score:   Fall Risk Score:  `1  Depression screen PHQ 2/9  Depression screen [facility] 2/9 08/21/2018 02/21/2018 12/05/2017 08/26/2017 05/27/2017 03/29/2017 03/01/2017  Decreased Interest 0 0 0 0 0 0  0  Down, Depressed, Hopeless 0 0 0 0 0 0 0  PHQ - 2 Score 0 0 0 0 0 0 0  Altered sleeping - - - - - - -  Tired, decreased energy - - - - - - -  Change in appetite - - - - - - -  Feeling bad or failure about yourself  - - - - - - -  Trouble concentrating - - - - - - -  Moving slowly or fidgety/restless - - - - - - -  Suicidal thoughts - - - - - - -  PHQ-9 Score - - - - - - -     Review of Systems  Constitutional: Negative.   HENT: Negative.   Eyes: Negative.   Respiratory: Negative.   Cardiovascular: Negative.   Gastrointestinal: Negative.   Endocrine: Negative.   Genitourinary: Negative.   Musculoskeletal: Positive for arthralgias, back pain and myalgias.  Skin: Negative.   Allergic/Immunologic: Negative.   Neurological: Negative.   Hematological: Negative.   Psychiatric/Behavioral: Negative.   All other systems reviewed and are negative.      Objective:   Physical Exam  Constitutional:  He is oriented to person, place, and time. He appears well-developed and well-nourished.  HENT:  Head: Normocephalic and atraumatic.  Eyes: Pupils are equal, round, and reactive to light.  Abdominal: Soft.  Musculoskeletal:  Number spine range of motion 50% flexion extension lateral rotation and bending  Neurological: He is alert and oriented to person, place, and time.  Motor strength is 5/5 bilateral hip flexor knee extensor right ankle dorsiflexor, left ankle dorsiflexors 4/5 Sensation intact light touch bilateral lower extremities.  He does state that the sensation his feet does feel different than normal however  Skin: Skin is warm and dry.  Psychiatric: He has a normal mood and affect.  Nursing note and vitals reviewed.         Assessment & Plan:  #1.  Lumbar postlaminectomy syndrome with chronic low back pain as well as chronic radicular pain radiating to left lower extremity.  Overall he is doing fine, he has ring gained weight post cancer treatment.  In fact he has overshot his weight and he is going to try to reduce it once again. We discussed the importance of exercise and ambulating 5 minutes 3 times a day working up to 10 minutes 3 times a day over the course 2 weeks.  He may follow this up with more aggressive training as time goes on. Indication for chronic opioid: See above Medication and dose: oxyIR 10mg  QID # pills per month: 120 Last UDS date: 06/27/2018 Opioid Treatment Agreement signed (Y/N): Yes Opioid Treatment Agreement last reviewed with patient:   [facility] reviewed this encounter (include red flags):  Yes no red flags  2.  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

## 2018-10-23 ENCOUNTER — Telehealth: Payer: Self-pay | Admitting: *Deleted

## 2018-10-23 MED ORDER — OXYCODONE HCL 10 MG PO TABS: 10.0000 mg | ORAL_TABLET | Freq: Four times a day (QID) | ORAL | 0 refills | Status: DC | PRN

## 2018-10-23 NOTE — Telephone Encounter (Signed)
Dr. Letta Pate note was reviewed. PMP Aware Web-site was reviewed last Oxycodone prescription was on 08/27/2018. Oxycodone prescription e-scribe.  Office staff will call Mr. Lamere.

## 2018-10-23 NOTE — Telephone Encounter (Signed)
Mr Caratachea says he went to pick up pain medication and pharmacy does not have it.  He was last seen by DR Letta Pate 09/28/18 and he did not order any pain medication for him.  His last fill was 08/29/18 on PMP and his appt is 12/17 with  Zella Ball.

## 2018-10-24 ENCOUNTER — Encounter: Payer: Medicare PPO | Attending: Physical Medicine & Rehabilitation | Admitting: Registered Nurse

## 2018-10-24 ENCOUNTER — Encounter: Payer: Self-pay | Admitting: Registered Nurse

## 2018-10-24 VITALS — BP 136/79 | HR 84 | Ht 76.0 in | Wt 254.0 lb

## 2018-10-24 DIAGNOSIS — Z5181 Encounter for therapeutic drug level monitoring: Secondary | ICD-10-CM | POA: Insufficient documentation

## 2018-10-24 DIAGNOSIS — M62838 Other muscle spasm: Secondary | ICD-10-CM

## 2018-10-24 DIAGNOSIS — M961 Postlaminectomy syndrome, not elsewhere classified: Secondary | ICD-10-CM

## 2018-10-24 DIAGNOSIS — M5416 Radiculopathy, lumbar region: Secondary | ICD-10-CM | POA: Diagnosis not present

## 2018-10-24 DIAGNOSIS — G620 Drug-induced polyneuropathy: Secondary | ICD-10-CM

## 2018-10-24 DIAGNOSIS — T451X5A Adverse effect of antineoplastic and immunosuppressive drugs, initial encounter: Secondary | ICD-10-CM

## 2018-10-24 DIAGNOSIS — G894 Chronic pain syndrome: Secondary | ICD-10-CM

## 2018-10-24 DIAGNOSIS — Z79899 Other long term (current) drug therapy: Secondary | ICD-10-CM

## 2018-10-24 NOTE — Progress Notes (Signed)
Subjective:    Patient ID: Logan Briggs, male    DOB: February 04, 1956, 62 y.o.   MRN: [id number]  HPI: Logan Briggs is a 62 y.o. male who returns for follow up appointment for chronic pain and medication refill. He states his  pain is located in his lower back radiating into his left lower extremity.He rates his pain 7. His current exercise regime is walking.  Mr. [place] Morphine equivalent is 60.00 MME.  Last Oral Swab was Performed on 06/27/2018, it was consistent.   Pain Inventory Average Pain 7 Pain Right Now 7 My pain is sharp and stabbing  In the last 24 hours, has pain interfered with the following? General activity 5 Relation with others 5 Enjoyment of life 6 What TIME of day is your pain at its worst? evening, night Sleep (in general) Good  Pain is worse with: some activites Pain improves with: heat/ice and medication Relief from Meds: 7  Mobility walk without assistance ability to climb steps?  yes do you drive?  yes  Function Do you have any goals in this area?  no  Neuro/Psych No problems in this area  Prior Studies Any changes since last visit?  no  Physicians involved in your care Any changes since last visit?  no   History reviewed. No pertinent family history. Social History   Socioeconomic History  . Marital status: Married    Spouse name: Not on file  . Number of children: Not on file  . Years of education: Not on file  . Highest education level: Not on file  Occupational History  . Not on file  Social Needs  . Financial resource strain: Not on file  . Food insecurity:    Worry: Not on file    Inability: Not on file  . Transportation needs:    Medical: Not on file    Non-medical: Not on file  Tobacco Use  . Smoking status: Former Smoker    Packs/day: 0.00    Years: 20.00    Pack years: 0.00    Types: Cigarettes  . Smokeless tobacco: Never Used  . Tobacco comment: desires to quit but starts again when upset or stressed    Substance and Sexual Activity  . Alcohol use: Yes    Comment: only on holidays, beer  . Drug use: No  . Sexual activity: Never  Lifestyle  . Physical activity:    Days per week: Not on file    Minutes per session: Not on file  . Stress: Not on file  Relationships  . Social connections:    Talks on phone: Not on file    Gets together: Not on file    Attends religious service: Not on file    Active member of club or organization: Not on file    Attends meetings of clubs or organizations: Not on file    Relationship status: Not on file  Other Topics Concern  . Not on file  Social History Narrative  . Not on file   Past Surgical History:  Procedure Laterality Date  . BACK SURGERY     Past Medical History:  Diagnosis Date  . Chronic back pain   . Glaucoma    BP 136/79   Pulse 84   Ht 6\' 4"  (1.93 m)   Wt 254 lb (115.2 kg)   SpO2 94%   BMI 30.92 kg/m   Opioid Risk Score:   Fall Risk Score:  `1  Depression screen [facility] 2/9  Depression screen [facility] 2/9 08/21/2018 02/21/2018 12/05/2017 08/26/2017 05/27/2017 03/29/2017 03/01/2017  Decreased Interest 0 0 0 0 0 0 0  Down, Depressed, Hopeless 0 0 0 0 0 0 0  PHQ - 2 Score 0 0 0 0 0 0 0  Altered sleeping - - - - - - -  Tired, decreased energy - - - - - - -  Change in appetite - - - - - - -  Feeling bad or failure about yourself  - - - - - - -  Trouble concentrating - - - - - - -  Moving slowly or fidgety/restless - - - - - - -  Suicidal thoughts - - - - - - -  PHQ-9 Score - - - - - - -    Review of Systems  Constitutional: Negative.   HENT: Negative.   Eyes: Negative.   Respiratory: Negative.   Cardiovascular: Negative.   Gastrointestinal: Negative.   Endocrine: Negative.   Genitourinary: Negative.   Musculoskeletal: Positive for arthralgias and back pain.  Allergic/Immunologic: Negative.   Hematological: Negative.   Psychiatric/Behavioral: Negative.   All other systems reviewed and are negative.      Objective:    Physical Exam Vitals signs and nursing note reviewed.  Constitutional:      Appearance: Normal appearance.  Neck:     Musculoskeletal: Normal range of motion and neck supple.  Cardiovascular:     Rate and Rhythm: Normal rate and regular rhythm.  Pulmonary:     Effort: Pulmonary effort is normal.     Breath sounds: Normal breath sounds.  Musculoskeletal:     Comments: Normal Muscle Bulk and Muscle Testing Reveals:  Upper Extremities: Full ROM and Muscle Strength 5/5 Back without spinal tenderness  Lower Extremities: Full ROM and Muscle Strength 5/5  Arises from Table with ease Narrow Based Gait   Skin:    General: Skin is warm and dry.  Neurological:     Mental Status: He is alert and oriented to person, place, and time.  Psychiatric:        Behavior: Behavior normal.           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 We will continue the opioid monitoring program, tis consists of regular clinic visits, examinations, urine drug screen, pill counts as well as use of [place] Controlled Substance Reporting System.  Continue with exercise regime. 2. 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 3. Debility secondary to his lung cancer:Reports good appetite, Continue to monitor.10/24/2018 4. Muscle Spasm: Continue Methocarbamol. 10/24/2018.   20 minutes of face to face patient care time was spent during this visit. All questions were encouraged and answered.  F/U in 1 month

## 2018-10-27 ENCOUNTER — Ambulatory Visit: Payer: Medicare PPO | Admitting: Registered Nurse

## 2018-11-17 ENCOUNTER — Encounter: Payer: Self-pay | Admitting: Registered Nurse

## 2018-11-17 ENCOUNTER — Encounter: Payer: Medicare PPO | Attending: Physical Medicine & Rehabilitation | Admitting: Registered Nurse

## 2018-11-17 VITALS — BP 153/81 | HR 67 | Resp 14 | Ht 76.0 in | Wt 257.0 lb

## 2018-11-17 DIAGNOSIS — G620 Drug-induced polyneuropathy: Secondary | ICD-10-CM | POA: Diagnosis not present

## 2018-11-17 DIAGNOSIS — G894 Chronic pain syndrome: Secondary | ICD-10-CM | POA: Insufficient documentation

## 2018-11-17 DIAGNOSIS — M961 Postlaminectomy syndrome, not elsewhere classified: Secondary | ICD-10-CM | POA: Diagnosis not present

## 2018-11-17 DIAGNOSIS — M5416 Radiculopathy, lumbar region: Secondary | ICD-10-CM

## 2018-11-17 DIAGNOSIS — T451X5A Adverse effect of antineoplastic and immunosuppressive drugs, initial encounter: Secondary | ICD-10-CM

## 2018-11-17 DIAGNOSIS — Z79899 Other long term (current) drug therapy: Secondary | ICD-10-CM | POA: Diagnosis present

## 2018-11-17 DIAGNOSIS — Z5181 Encounter for therapeutic drug level monitoring: Secondary | ICD-10-CM | POA: Diagnosis present

## 2018-11-17 DIAGNOSIS — M62838 Other muscle spasm: Secondary | ICD-10-CM

## 2018-11-17 MED ORDER — OXYCODONE HCL 10 MG PO TABS: 10.0000 mg | ORAL_TABLET | Freq: Four times a day (QID) | ORAL | 0 refills | Status: DC | PRN

## 2018-11-17 NOTE — Progress Notes (Signed)
Subjective:    Patient ID: Logan Briggs, male    DOB: Mar 18, 1956, 63 y.o.   MRN: [id number]  HPI: Logan Briggs is a 63 y.o. male who returns for follow up appointment for chronic pain and medication refill. He states his pain is located in his lower back radiating into his bilateral lower extremities L>R/ He rates his pain 7. His current exercise regime is walking and performing stretching exercises.  [name] Morphine equivalent is 60.00 MME.  Last Oral Swab was Performed on 06/27/2018, it was consistent.   Pain Inventory Average Pain 7 Pain Right Now 7 My pain is sharp and stabbing  In the last 24 hours, has pain interfered with the following? General activity 5 Relation with others 5 Enjoyment of life 6 What TIME of day is your pain at its worst? evening, night  Sleep (in general) Good  Pain is worse with: some activites Pain improves with: heat/ice and medication Relief from Meds: 7  Mobility walk without assistance ability to climb steps?  yes do you drive?  yes Do you have any goals in this area?  no  Function Do you have any goals in this area?  no  Neuro/Psych No problems in this area  Prior Studies Any changes since last visit?  no  Physicians involved in your care Any changes since last visit?  no   History reviewed. No pertinent family history. Social History   Socioeconomic History  . Marital status: Married    Spouse name: Not on file  . Number of children: Not on file  . Years of education: Not on file  . Highest education level: Not on file  Occupational History  . Not on file  Social Needs  . Financial resource strain: Not on file  . Food insecurity:    Worry: Not on file    Inability: Not on file  . Transportation needs:    Medical: Not on file    Non-medical: Not on file  Tobacco Use  . Smoking status: Former Smoker    Packs/day: 0.00    Years: 20.00    Pack years: 0.00    Types: Cigarettes  . Smokeless tobacco: Never  Used  . Tobacco comment: desires to quit but starts again when upset or stressed  Substance and Sexual Activity  . Alcohol use: Yes    Comment: only on holidays, beer  . Drug use: No  . Sexual activity: Never  Lifestyle  . Physical activity:    Days per week: Not on file    Minutes per session: Not on file  . Stress: Not on file  Relationships  . Social connections:    Talks on phone: Not on file    Gets together: Not on file    Attends religious service: Not on file    Active member of club or organization: Not on file    Attends meetings of clubs or organizations: Not on file    Relationship status: Not on file  Other Topics Concern  . Not on file  Social History Narrative  . Not on file   Past Surgical History:  Procedure Laterality Date  . BACK SURGERY     Past Medical History:  Diagnosis Date  . Chronic back pain   . Glaucoma    BP (!) 153/81 (BP Location: Left Arm, Patient Position: Sitting, Cuff Size: Large)   Pulse 67   Resp 14   Ht 6\' 4"  (1.93 m)   Wt  257 lb (116.6 kg)   SpO2 96%   BMI 31.28 kg/m   Opioid Risk Score:   Fall Risk Score:  `1  Depression screen PHQ 2/9  Depression screen [facility] 2/9 08/21/2018 02/21/2018 12/05/2017 08/26/2017 05/27/2017 03/29/2017 03/01/2017  Decreased Interest 0 0 0 0 0 0 0  Down, Depressed, Hopeless 0 0 0 0 0 0 0  PHQ - 2 Score 0 0 0 0 0 0 0  Altered sleeping - - - - - - -  Tired, decreased energy - - - - - - -  Change in appetite - - - - - - -  Feeling bad or failure about yourself  - - - - - - -  Trouble concentrating - - - - - - -  Moving slowly or fidgety/restless - - - - - - -  Suicidal thoughts - - - - - - -  PHQ-9 Score - - - - - - -    Review of Systems  Constitutional: Negative.   HENT: Negative.   Eyes: Negative.   Respiratory: Negative.   Cardiovascular: Negative.   Gastrointestinal: Negative.   Endocrine: Negative.   Genitourinary: Negative.   Musculoskeletal: Positive for back pain.  Skin: Negative.    Allergic/Immunologic: Negative.   Neurological: Negative.   Hematological: Negative.   Psychiatric/Behavioral: Negative.   All other systems reviewed and are negative.      Objective:   Physical Exam Vitals signs and nursing note reviewed.  Constitutional:      Appearance: Normal appearance.  Neck:     Musculoskeletal: Normal range of motion and neck supple.  Cardiovascular:     Rate and Rhythm: Normal rate and regular rhythm.     Pulses: Normal pulses.     Heart sounds: Normal heart sounds.  Pulmonary:     Effort: Pulmonary effort is normal.     Breath sounds: Normal breath sounds.  Musculoskeletal:     Comments: Normal Muscle Bulk and Muscle Testing Reveals:  Upper Extremities: Full ROM and Muscle Strength 5/5  Lumbar Paraspinal Tenderness: L-3-L-5 Lower Extremities: Full ROM and Muscle Strength 5/5 Arises from Table with ease Narrow Based Gait   Skin:    General: Skin is warm and dry.  Neurological:     Mental Status: He is alert and oriented to person, place, and time.  Psychiatric:        Mood and Affect: Mood normal.        Behavior: Behavior normal.           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 We will continue the opioid monitoring program, tis consists of regular clinic visits, examinations, urine drug screen, pill counts as well as use of [place] Controlled Substance Reporting System.  Continue with exercise regime. 2. 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 3. Debility secondary to his lung cancer:Reports good appetite, Continue to monitor.11/17/2018 4. Muscle Spasm: Continue Methocarbamol. 11/17/2018.  20 minutes of face to face patient care time was spent during this visit. All questions were encouraged and answered.  F/U in  1 month

## 2018-12-19 ENCOUNTER — Encounter: Payer: Medicare PPO | Attending: Physical Medicine & Rehabilitation | Admitting: Registered Nurse

## 2018-12-19 ENCOUNTER — Other Ambulatory Visit: Payer: Self-pay

## 2018-12-19 ENCOUNTER — Encounter: Payer: Self-pay | Admitting: Registered Nurse

## 2018-12-19 VITALS — BP 141/87 | HR 96 | Ht 75.0 in | Wt 256.0 lb

## 2018-12-19 DIAGNOSIS — Z79899 Other long term (current) drug therapy: Secondary | ICD-10-CM

## 2018-12-19 DIAGNOSIS — M5416 Radiculopathy, lumbar region: Secondary | ICD-10-CM | POA: Diagnosis not present

## 2018-12-19 DIAGNOSIS — G620 Drug-induced polyneuropathy: Secondary | ICD-10-CM

## 2018-12-19 DIAGNOSIS — M961 Postlaminectomy syndrome, not elsewhere classified: Secondary | ICD-10-CM

## 2018-12-19 DIAGNOSIS — Z5181 Encounter for therapeutic drug level monitoring: Secondary | ICD-10-CM

## 2018-12-19 DIAGNOSIS — G894 Chronic pain syndrome: Secondary | ICD-10-CM | POA: Diagnosis not present

## 2018-12-19 DIAGNOSIS — T451X5A Adverse effect of antineoplastic and immunosuppressive drugs, initial encounter: Secondary | ICD-10-CM

## 2018-12-19 MED ORDER — OXYCODONE HCL 10 MG PO TABS: 10.0000 mg | ORAL_TABLET | Freq: Four times a day (QID) | ORAL | 0 refills | Status: DC | PRN

## 2018-12-19 NOTE — Progress Notes (Signed)
Subjective:    Patient ID: Logan Briggs, male    DOB: [birth year], 63 y.o.   MRN: [id number]  HPI: Logan Briggs is a 63 y.o. male who returns for follow up  appointment for chronic pain and medication refill. He states his pain is located in his bilateral hands with tingling and numbness and lower back radiating into his left lower extremity. He rates his pain 7. His current exercise regime is walking.  Mr. [place] Morphine equivalent is 60.00MME.  Last Oral Swab was performed on 06/27/2018, it was consistent.   Pain Inventory Average Pain 7 Pain Right Now 7 My pain is sharp and stabbing  In the last 24 hours, has pain interfered with the following? General activity 5 Relation with others 5 Enjoyment of life 6 What TIME of day is your pain at its worst? evening and night Sleep (in general) Good  Pain is worse with: some activites Pain improves with: heat/ice and medication Relief from Meds: 7  Mobility Do you have any goals in this area?  no  Function Do you have any goals in this area?  no  Neuro/Psych No problems in this area  Prior Studies Any changes since last visit?  no  Physicians involved in your care Any changes since last visit?  no   No family history on file. Social History   Socioeconomic History  . Marital status: Married    Spouse name: Not on file  . Number of children: Not on file  . Years of education: Not on file  . Highest education level: Not on file  Occupational History  . Not on file  Social Needs  . Financial resource strain: Not on file  . Food insecurity:    Worry: Not on file    Inability: Not on file  . Transportation needs:    Medical: Not on file    Non-medical: Not on file  Tobacco Use  . Smoking status: Former Smoker    Packs/day: 0.00    Years: 20.00    Pack years: 0.00    Types: Cigarettes  . Smokeless tobacco: Never Used  . Tobacco comment: desires to quit but starts again when upset or stressed  Substance  and Sexual Activity  . Alcohol use: Yes    Comment: only on holidays, beer  . Drug use: No  . Sexual activity: Never  Lifestyle  . Physical activity:    Days per week: Not on file    Minutes per session: Not on file  . Stress: Not on file  Relationships  . Social connections:    Talks on phone: Not on file    Gets together: Not on file    Attends religious service: Not on file    Active member of club or organization: Not on file    Attends meetings of clubs or organizations: Not on file    Relationship status: Not on file  Other Topics Concern  . Not on file  Social History Narrative  . Not on file   Past Surgical History:  Procedure Laterality Date  . BACK SURGERY     Past Medical History:  Diagnosis Date  . Chronic back pain   . Glaucoma    BP (!) 141/87   Pulse 96   Ht 6\' 3"  (1.905 m)   Wt 256 lb (116.1 kg)   SpO2 95%   BMI 32.00 kg/m   Opioid Risk Score:   Fall Risk Score:  `1  Depression  screen PHQ 2/9  Depression screen [facility] 2/9 12/19/2018 08/21/2018 02/21/2018 12/05/2017 08/26/2017 05/27/2017 03/29/2017  Decreased Interest 0 0 0 0 0 0 0  Down, Depressed, Hopeless 0 0 0 0 0 0 0  PHQ - 2 Score 0 0 0 0 0 0 0  Altered sleeping - - - - - - -  Tired, decreased energy - - - - - - -  Change in appetite - - - - - - -  Feeling bad or failure about yourself  - - - - - - -  Trouble concentrating - - - - - - -  Moving slowly or fidgety/restless - - - - - - -  Suicidal thoughts - - - - - - -  PHQ-9 Score - - - - - - -   Review of Systems  Constitutional: Negative.   HENT: Negative.   Eyes: Negative.   Respiratory: Negative.   Cardiovascular: Negative.   Gastrointestinal: Negative.   Endocrine: Negative.   Genitourinary: Negative.   Musculoskeletal: Negative.   Skin: Negative.   Allergic/Immunologic: Negative.   Neurological: Negative.   Hematological: Negative.   Psychiatric/Behavioral: Negative.   All other systems reviewed and are negative.        Objective:   Physical Exam Vitals signs and nursing note reviewed.  Constitutional:      Appearance: Normal appearance.  Neck:     Musculoskeletal: Normal range of motion and neck supple.  Cardiovascular:     Rate and Rhythm: Normal rate and regular rhythm.     Pulses: Normal pulses.     Heart sounds: Normal heart sounds.  Pulmonary:     Effort: Pulmonary effort is normal.     Breath sounds: Normal breath sounds.  Musculoskeletal:     Comments: Normal Muscle Bulk and Muscle Testing Reveals:  Upper Extremities: Full ROM and Muscle Strength 5/5 Back without spinal tenderness noted Lower Extremities: Full ROM and Muscle Strength 5/5 Arises from Table with ease Narrow Based Gait   Skin:    General: Skin is warm and dry.  Neurological:     Mental Status: He is alert and oriented to person, place, and time.  Psychiatric:        Mood and Affect: Mood normal.        Behavior: Behavior normal.           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 We will continue the opioid monitoring program, tis consists of regular clinic visits, examinations, urine drug screen, pill counts as well as use of [place] Controlled Substance Reporting System.  Continue with exercise regime. 2. 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 3. Debility secondary to his lung cancer:Reports good appetite, Continue to monitor.12/19/2018 4. Muscle Spasm: Continue Methocarbamol. 12/19/2018.  20 minutes of face to face patient care time was spent during this visit. All questions were encouraged and answered.  F/U in 1 month

## 2019-01-18 ENCOUNTER — Encounter: Payer: Self-pay | Admitting: Registered Nurse

## 2019-01-18 ENCOUNTER — Other Ambulatory Visit: Payer: Self-pay

## 2019-01-18 ENCOUNTER — Encounter: Payer: Medicare HMO | Attending: Physical Medicine & Rehabilitation | Admitting: Registered Nurse

## 2019-01-18 VITALS — BP 156/89 | HR 75

## 2019-01-18 DIAGNOSIS — M5416 Radiculopathy, lumbar region: Secondary | ICD-10-CM

## 2019-01-18 DIAGNOSIS — Z5181 Encounter for therapeutic drug level monitoring: Secondary | ICD-10-CM | POA: Diagnosis not present

## 2019-01-18 DIAGNOSIS — G894 Chronic pain syndrome: Secondary | ICD-10-CM | POA: Diagnosis present

## 2019-01-18 DIAGNOSIS — Z79899 Other long term (current) drug therapy: Secondary | ICD-10-CM | POA: Insufficient documentation

## 2019-01-18 DIAGNOSIS — M961 Postlaminectomy syndrome, not elsewhere classified: Secondary | ICD-10-CM | POA: Diagnosis not present

## 2019-01-18 DIAGNOSIS — G620 Drug-induced polyneuropathy: Secondary | ICD-10-CM

## 2019-01-18 DIAGNOSIS — T451X5A Adverse effect of antineoplastic and immunosuppressive drugs, initial encounter: Secondary | ICD-10-CM

## 2019-01-18 MED ORDER — OXYCODONE HCL 10 MG PO TABS: 10.0000 mg | ORAL_TABLET | Freq: Four times a day (QID) | ORAL | 0 refills | Status: DC | PRN

## 2019-01-18 NOTE — Progress Notes (Signed)
Subjective:    Patient ID: Logan Briggs, male    DOB: Sep 05, 1956, 63 y.o.   MRN: [id number]  HPI: LEVANDER KATZENSTEIN is a 63 y.o. male who returns for follow up appointment for chronic pain and medication refill. He states his  pain is located in his lower back radiating into his left lower extremity. He rates his pain 7. His current exercise regime is walking.  Mr. [place] Morphine equivalent is 60.00 MME. Last Oral Swab was Performed on 06/27/2018, it was consistent.    Pain Inventory Average Pain 7 Pain Right Now 7 My pain is sharp and stabbing  In the last 24 hours, has pain interfered with the following? General activity 5 Relation with others 5 Enjoyment of life 6 What TIME of day is your pain at its worst? night Sleep (in general) Good  Pain is worse with: na Pain improves with: rest and medication Relief from Meds: 7  Mobility ability to climb steps?  yes do you drive?  yes  Function Do you have any goals in this area?  no  Neuro/Psych No problems in this area  Prior Studies Any changes since last visit?  no  Physicians involved in your care Any changes since last visit?  no   History reviewed. No pertinent family history. Social History   Socioeconomic History  . Marital status: Married    Spouse name: Not on file  . Number of children: Not on file  . Years of education: Not on file  . Highest education level: Not on file  Occupational History  . Not on file  Social Needs  . Financial resource strain: Not on file  . Food insecurity:    Worry: Not on file    Inability: Not on file  . Transportation needs:    Medical: Not on file    Non-medical: Not on file  Tobacco Use  . Smoking status: Former Smoker    Packs/day: 0.00    Years: 20.00    Pack years: 0.00    Types: Cigarettes  . Smokeless tobacco: Never Used  . Tobacco comment: desires to quit but starts again when upset or stressed  Substance and Sexual Activity  . Alcohol use: Yes   Comment: only on holidays, beer  . Drug use: No  . Sexual activity: Never  Lifestyle  . Physical activity:    Days per week: Not on file    Minutes per session: Not on file  . Stress: Not on file  Relationships  . Social connections:    Talks on phone: Not on file    Gets together: Not on file    Attends religious service: Not on file    Active member of club or organization: Not on file    Attends meetings of clubs or organizations: Not on file    Relationship status: Not on file  Other Topics Concern  . Not on file  Social History Narrative  . Not on file   Past Surgical History:  Procedure Laterality Date  . BACK SURGERY     Past Medical History:  Diagnosis Date  . Chronic back pain   . Glaucoma    BP (!) 156/89 (BP Location: Left Arm, Patient Position: Sitting, Cuff Size: Large)   Pulse 75   SpO2 97%   Opioid Risk Score:   Fall Risk Score:  `1  Depression screen [facility] 2/9  Depression screen [facility] 2/9 12/19/2018 08/21/2018 02/21/2018 12/05/2017 08/26/2017 05/27/2017 03/29/2017  Decreased Interest  0 0 0 0 0 0 0  Down, Depressed, Hopeless 0 0 0 0 0 0 0  PHQ - 2 Score 0 0 0 0 0 0 0  Altered sleeping - - - - - - -  Tired, decreased energy - - - - - - -  Change in appetite - - - - - - -  Feeling bad or failure about yourself  - - - - - - -  Trouble concentrating - - - - - - -  Moving slowly or fidgety/restless - - - - - - -  Suicidal thoughts - - - - - - -  PHQ-9 Score - - - - - - -     Review of Systems  Constitutional: Negative.   HENT: Negative.   Eyes: Negative.   Respiratory: Negative.   Cardiovascular: Negative.   Gastrointestinal: Negative.   Endocrine: Negative.   Genitourinary: Negative.   Musculoskeletal: Positive for arthralgias and myalgias.  Skin: Negative.   Allergic/Immunologic: Negative.   Neurological: Negative.   Hematological: Negative.   Psychiatric/Behavioral: Negative.   All other systems reviewed and are negative.      Objective:    Physical Exam Vitals signs and nursing note reviewed.  Constitutional:      Appearance: Normal appearance.  Neck:     Musculoskeletal: Normal range of motion and neck supple.  Cardiovascular:     Rate and Rhythm: Normal rate and regular rhythm.     Pulses: Normal pulses.     Heart sounds: Normal heart sounds.  Pulmonary:     Effort: Pulmonary effort is normal.     Breath sounds: Normal breath sounds.  Musculoskeletal:     Comments: Normal Muscle Bulk and Muscle Testing Reveals:  Upper Extremities: Full ROM and Muscle Strength 5/5  Back without spinal tenderness noted  Lower Extremities: Full ROM and Muscle Strength 5/5 Arises from Table with ease Narrow Base Gait   Skin:    General: Skin is warm and dry.  Neurological:     Mental Status: He is alert and oriented to person, place, and time.  Psychiatric:        Mood and Affect: Mood normal.        Behavior: Behavior normal.           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 We will continue the opioid monitoring program, tis consists of regular clinic visits, examinations, urine drug screen, pill counts as well as use of [place] Controlled Substance Reporting System.  Continue with exercise regime. 2. 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 3. Debility secondary to his lung cancer:Reports good appetite, Continue to monitor.01/18/2019 4. Muscle Spasm: Continue Methocarbamol.01/18/2019.  20 minutes of face to face patient care time was spent during this visit. All questions were encouraged and answered.  F/U in 1 month

## 2019-01-24 LAB — TOXASSURE SELECT,+ANTIDEPR,UR

## 2019-01-26 ENCOUNTER — Telehealth: Payer: Self-pay | Admitting: *Deleted

## 2019-01-26 NOTE — Telephone Encounter (Signed)
Urine drug screen for this encounter is consistent for prescribed medication 

## 2019-02-20 ENCOUNTER — Encounter: Payer: Medicare HMO | Attending: Physical Medicine & Rehabilitation | Admitting: Registered Nurse

## 2019-02-20 ENCOUNTER — Encounter: Payer: Self-pay | Admitting: Registered Nurse

## 2019-02-20 ENCOUNTER — Other Ambulatory Visit: Payer: Self-pay

## 2019-02-20 VITALS — Ht 75.0 in | Wt 256.0 lb

## 2019-02-20 DIAGNOSIS — M5416 Radiculopathy, lumbar region: Secondary | ICD-10-CM | POA: Diagnosis not present

## 2019-02-20 DIAGNOSIS — G894 Chronic pain syndrome: Secondary | ICD-10-CM

## 2019-02-20 DIAGNOSIS — M961 Postlaminectomy syndrome, not elsewhere classified: Secondary | ICD-10-CM

## 2019-02-20 DIAGNOSIS — T451X5A Adverse effect of antineoplastic and immunosuppressive drugs, initial encounter: Secondary | ICD-10-CM

## 2019-02-20 DIAGNOSIS — Z5181 Encounter for therapeutic drug level monitoring: Secondary | ICD-10-CM

## 2019-02-20 DIAGNOSIS — G620 Drug-induced polyneuropathy: Secondary | ICD-10-CM | POA: Diagnosis not present

## 2019-02-20 DIAGNOSIS — Z79899 Other long term (current) drug therapy: Secondary | ICD-10-CM

## 2019-02-20 MED ORDER — OXYCODONE HCL 10 MG PO TABS: 10.0000 mg | ORAL_TABLET | Freq: Four times a day (QID) | ORAL | 0 refills | Status: DC | PRN

## 2019-02-20 NOTE — Progress Notes (Signed)
Subjective:    Patient ID: Logan Briggs, male    DOB: Sep 17, 1956, 63 y.o.   MRN: [id number]  HPI: Logan Briggs is a 63 y.o. male his appointment was changed, due to national recommendations of social distancing due to [place] 19, an audio/video telehealth visit is felt to be most appropriate for this patient at this time.  See Chart message from today for the patient's consent to telehealth from [place].     He states his  pain is located in his lower back radiating into his left lower extremity. Also reports increase intensity and frequency of tingling and burning into his left arm ( neuropathic pain), he was instructed to increase his gabapentin to three times a day and call office in a week to evaluate, he verbalizes understanding. He rates his pain 7. His. current exercise regime is walking.  Logan Briggs Morphine equivalent is 60.00MME. Last UDS was Performed on 01/18/2019, it was consistent.   Mancel Parsons CMA asked the Health and History Questions. This provider and Mancel Parsons verified we were speaking with the correct person using two identifiers.   Pain Inventory Average Pain 8 Pain Right Now 7 My pain is intermittent and aching  In the last 24 hours, has pain interfered with the following? General activity 10 Relation with others 8 Enjoyment of life 10 What TIME of day is your pain at its worst? all Sleep (in general) Fair  Pain is worse with: standing Pain improves with: medication Relief from Meds: 10  Mobility walk without assistance how many minutes can you walk? 5 ability to climb steps?  yes do you drive?  yes  Function disabled: date disabled . retired  Neuro/Psych numbness tingling  Prior Studies Any changes since last visit?  no  Physicians involved in your care Any changes since last visit?  no   History reviewed. No pertinent family history. Social History   Socioeconomic History  . Marital  status: Married    Spouse name: Not on file  . Number of children: Not on file  . Years of education: Not on file  . Highest education level: Not on file  Occupational History  . Not on file  Social Needs  . Financial resource strain: Not on file  . Food insecurity:    Worry: Not on file    Inability: Not on file  . Transportation needs:    Medical: Not on file    Non-medical: Not on file  Tobacco Use  . Smoking status: Former Smoker    Packs/day: 0.00    Years: 20.00    Pack years: 0.00    Types: Cigarettes  . Smokeless tobacco: Never Used  . Tobacco comment: desires to quit but starts again when upset or stressed  Substance and Sexual Activity  . Alcohol use: Yes    Comment: only on holidays, beer  . Drug use: No  . Sexual activity: Never  Lifestyle  . Physical activity:    Days per week: Not on file    Minutes per session: Not on file  . Stress: Not on file  Relationships  . Social connections:    Talks on phone: Not on file    Gets together: Not on file    Attends religious service: Not on file    Active member of club or organization: Not on file    Attends meetings of clubs or organizations: Not on file    Relationship status: Not  on file  Other Topics Concern  . Not on file  Social History Narrative  . Not on file   Past Surgical History:  Procedure Laterality Date  . BACK SURGERY     Past Medical History:  Diagnosis Date  . Chronic back pain   . Glaucoma    Ht 6\' 3"  (1.905 m)   Wt 256 lb (116.1 kg)   BMI 32.00 kg/m   Opioid Risk Score:   Fall Risk Score:  `1  Depression screen PHQ 2/9  Depression screen [facility] 2/9 12/19/2018 08/21/2018 02/21/2018 12/05/2017 08/26/2017 05/27/2017 03/29/2017  Decreased Interest 0 0 0 0 0 0 0  Down, Depressed, Hopeless 0 0 0 0 0 0 0  PHQ - 2 Score 0 0 0 0 0 0 0  Altered sleeping - - - - - - -  Tired, decreased energy - - - - - - -  Change in appetite - - - - - - -  Feeling bad or failure about yourself  - - - - - -  -  Trouble concentrating - - - - - - -  Moving slowly or fidgety/restless - - - - - - -  Suicidal thoughts - - - - - - -  PHQ-9 Score - - - - - - -     Review of Systems  Constitutional: Negative.   HENT: Negative.   Eyes: Negative.   Respiratory: Negative.   Cardiovascular: Negative.   Gastrointestinal: Negative.   Endocrine: Negative.   Genitourinary: Negative.   Musculoskeletal: Positive for back pain and gait problem.  Skin: Negative.   Allergic/Immunologic: Negative.   Neurological: Positive for numbness.       Tingling  Hematological: Negative.   Psychiatric/Behavioral: Negative.        Objective:   Physical Exam Vitals signs and nursing note reviewed.  Musculoskeletal:     Comments: No Physical Exam: Virtual Visit  Neurological:     Mental Status: He is oriented to person, place, and time.           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 We will continue the opioid monitoring program, tis consists of regular clinic visits, examinations, urine drug screen, pill counts as well as use of [place] Controlled Substance Reporting System.  Continue with exercise regime. 2. Lumbar Radiculopathy/ Chronic Radiculopathy/ Chemotherapy Induced Neuropathy:ContinueGabapentin Oncology Following.02/20/2019.. 3. Debility secondary to his lung cancer:Reports good appetite, Continue to monitor.02/20/2019 4. Muscle Spasm: Continue Methocarbamol.02/20/2019.  F/U in 1 month  Telephone Call  Location of patient: In his Home Location of provider: Office Established patient Time spent on call: 15 Minutes

## 2019-03-22 ENCOUNTER — Other Ambulatory Visit: Payer: Self-pay

## 2019-03-22 ENCOUNTER — Encounter: Payer: Self-pay | Admitting: Registered Nurse

## 2019-03-22 ENCOUNTER — Encounter: Payer: Medicare HMO | Attending: Physical Medicine & Rehabilitation | Admitting: Registered Nurse

## 2019-03-22 VITALS — Ht 76.0 in | Wt 248.0 lb

## 2019-03-22 DIAGNOSIS — M961 Postlaminectomy syndrome, not elsewhere classified: Secondary | ICD-10-CM | POA: Diagnosis not present

## 2019-03-22 DIAGNOSIS — G620 Drug-induced polyneuropathy: Secondary | ICD-10-CM | POA: Diagnosis not present

## 2019-03-22 DIAGNOSIS — Z5181 Encounter for therapeutic drug level monitoring: Secondary | ICD-10-CM

## 2019-03-22 DIAGNOSIS — M5416 Radiculopathy, lumbar region: Secondary | ICD-10-CM

## 2019-03-22 DIAGNOSIS — G894 Chronic pain syndrome: Secondary | ICD-10-CM | POA: Diagnosis not present

## 2019-03-22 DIAGNOSIS — Z79899 Other long term (current) drug therapy: Secondary | ICD-10-CM | POA: Insufficient documentation

## 2019-03-22 DIAGNOSIS — T451X5A Adverse effect of antineoplastic and immunosuppressive drugs, initial encounter: Secondary | ICD-10-CM

## 2019-03-22 MED ORDER — OXYCODONE HCL 10 MG PO TABS: 10.0000 mg | ORAL_TABLET | Freq: Four times a day (QID) | ORAL | 0 refills | Status: DC | PRN

## 2019-03-22 NOTE — Progress Notes (Signed)
Subjective:    Patient ID: Logan Briggs, male    DOB: 10/23/1956, 63 y.o.   MRN: [id number]  HPI: Logan Briggs is a 63 y.o. male  His appointment was changed, due to national recommendations of social distancing due to [place] 19, an audio/video telehealth visit is felt to be most appropriate for this patient at this time.  See Chart message from today for the patient's consent to telehealth from [place].    He states his pain is located in his lower back radiating into his left lower extremity. He rates his  Pain 7. His current exercise regime is walking.  Logan Briggs Morphine equivalent is  60.00 MME.  Last UDS was Performed on 01/18/2019, it was consistent.   Logan Briggs CMA asked the Health and History questions. This provider and Logan Briggs  verified we were  speaking with the correct person using two identifiers.  Pain Inventory Average Pain 7 Pain Right Now 7 My pain is intermittent and dull  In the last 24 hours, has pain interfered with the following? General activity 0 Relation with others 0 Enjoyment of life 0 What TIME of day is your pain at its worst? night Sleep (in general) Poor  Pain is worse with: standing and some activites Pain improves with: medication Relief from Meds: 3  Mobility how many minutes can you walk? 30 ability to climb steps?  yes do you drive?  yes  Function disabled: date disabled na  Neuro/Psych weakness numbness  Prior Studies Any changes since last visit?  no  Physicians involved in your care Any changes since last visit?  no   No family history on file. Social History   Socioeconomic History  . Marital status: Married    Spouse name: Not on file  . Number of children: Not on file  . Years of education: Not on file  . Highest education level: Not on file  Occupational History  . Not on file  Social Needs  . Financial resource strain: Not on file  . Food insecurity:   Worry: Not on file    Inability: Not on file  . Transportation needs:    Medical: Not on file    Non-medical: Not on file  Tobacco Use  . Smoking status: Former Smoker    Packs/day: 0.00    Years: 20.00    Pack years: 0.00    Types: Cigarettes  . Smokeless tobacco: Never Used  . Tobacco comment: desires to quit but starts again when upset or stressed  Substance and Sexual Activity  . Alcohol use: Yes    Comment: only on holidays, beer  . Drug use: No  . Sexual activity: Never  Lifestyle  . Physical activity:    Days per week: Not on file    Minutes per session: Not on file  . Stress: Not on file  Relationships  . Social connections:    Talks on phone: Not on file    Gets together: Not on file    Attends religious service: Not on file    Active member of club or organization: Not on file    Attends meetings of clubs or organizations: Not on file    Relationship status: Not on file  Other Topics Concern  . Not on file  Social History Narrative  . Not on file   Past Surgical History:  Procedure Laterality Date  . BACK SURGERY     Past Medical History:  Diagnosis Date  . Chronic back pain   . Glaucoma    Ht 6\' 4"  (1.93 m)   Wt 248 lb (112.5 kg)   BMI 30.19 kg/m   Opioid Risk Score:   Fall Risk Score:  `1  Depression screen PHQ 2/9  Depression screen [facility] 2/9 03/22/2019 12/19/2018 08/21/2018 02/21/2018 12/05/2017 08/26/2017 05/27/2017  Decreased Interest 0 0 0 0 0 0 0  Down, Depressed, Hopeless 0 0 0 0 0 0 0  PHQ - 2 Score 0 0 0 0 0 0 0  Altered sleeping - - - - - - -  Tired, decreased energy - - - - - - -  Change in appetite - - - - - - -  Feeling bad or failure about yourself  - - - - - - -  Trouble concentrating - - - - - - -  Moving slowly or fidgety/restless - - - - - - -  Suicidal thoughts - - - - - - -  PHQ-9 Score - - - - - - -    Review of Systems  Constitutional: Negative.   HENT: Negative.   Eyes: Negative.   Respiratory: Negative.    Cardiovascular: Negative.   Gastrointestinal: Negative.   Endocrine: Negative.   Genitourinary: Negative.   Musculoskeletal: Positive for back pain.  Allergic/Immunologic: Negative.   Neurological: Positive for weakness and numbness.  Hematological: Negative.   Psychiatric/Behavioral: Negative.   All other systems reviewed and are negative.      Objective:   Physical Exam Vitals signs and nursing note reviewed.  Musculoskeletal:     Comments: No Physical Exam Performed: Virtual Visit  Neurological:     Mental Status: He is oriented to person, place, and time.           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 We will continue the opioid monitoring program, tis consists of regular clinic visits, examinations, urine drug screen, pill counts as well as use of [place] Controlled Substance Reporting System.  Continue with exercise regime. 2. Lumbar Radiculopathy/ Chronic Radiculopathy/ Chemotherapy Induced Neuropathy: Logan Briggs reports he discontinued the Gabapentin due to side effects.  Oncology Following.03/22/2019.. 3. Debility secondary to his lung cancer:Reports good appetite, Continue to monitor.03/22/2019 4. Muscle Spasm: Continue Methocarbamol.03/22/2019.  F/U in 1 Month Telephone Call  Location of patient: In His Home Location of provider: Office Established patient Time spent on call:10 Minutes

## 2019-03-31 ENCOUNTER — Encounter (HOSPITAL_BASED_OUTPATIENT_CLINIC_OR_DEPARTMENT_OTHER): Payer: Self-pay | Admitting: *Deleted

## 2019-03-31 ENCOUNTER — Other Ambulatory Visit: Payer: Self-pay

## 2019-03-31 ENCOUNTER — Emergency Department (HOSPITAL_BASED_OUTPATIENT_CLINIC_OR_DEPARTMENT_OTHER)
Admission: EM | Admit: 2019-03-31 | Discharge: 2019-03-31 | Disposition: A | Discharge status: Home / Self Care | Payer: Medicare HMO | Attending: Emergency Medicine | Admitting: Emergency Medicine

## 2019-03-31 DIAGNOSIS — Z85118 Personal history of other malignant neoplasm of bronchus and lung: Secondary | ICD-10-CM | POA: Insufficient documentation

## 2019-03-31 DIAGNOSIS — M546 Pain in thoracic spine: Secondary | ICD-10-CM | POA: Diagnosis present

## 2019-03-31 DIAGNOSIS — Z87891 Personal history of nicotine dependence: Secondary | ICD-10-CM | POA: Diagnosis not present

## 2019-03-31 DIAGNOSIS — Z9221 Personal history of antineoplastic chemotherapy: Secondary | ICD-10-CM | POA: Diagnosis not present

## 2019-03-31 HISTORY — DX: Malignant neoplasm of unspecified part of unspecified bronchus or lung: C34.90

## 2019-03-31 MED ORDER — LIDOCAINE-EPINEPHRINE (PF) 2 %-1:200000 IJ SOLN
INTRAMUSCULAR | Status: AC
  Filled 2019-03-31: qty 10

## 2019-03-31 MED ORDER — LIDOCAINE-EPINEPHRINE (PF) 2 %-1:200000 IJ SOLN
10.0000 mL | Freq: Once | INTRAMUSCULAR | Status: AC
  Administered 2019-03-31: 10 mL
  Filled 2019-03-31: qty 10

## 2019-03-31 MED ORDER — DICLOFENAC SODIUM 1 % TD GEL: 4.0000 g | Freq: Four times a day (QID) | TRANSDERMAL | 0 refills | Status: AC

## 2019-03-31 NOTE — ED Notes (Signed)
ED Provider at bedside. 

## 2019-03-31 NOTE — ED Triage Notes (Signed)
Pt reports right shoulder pain x 1 week. Denies injury. He is on oxycodone for back pain which isn't helping the shoulder

## 2019-03-31 NOTE — Discharge Instructions (Signed)
Try and rest this area as best you can.  Try to limit the use of your right arm.  Please follow-up with your family doctor they may want to refer you to a physical therapist or a sports medicine physician to further evaluate this.

## 2019-03-31 NOTE — ED Provider Notes (Signed)
[facility] EMERGENCY DEPARTMENT Provider Note   CSN: [id number] Arrival date & time: 03/31/19  1816    History   Chief Complaint Chief Complaint  Patient presents with  . Shoulder Pain    HPI Logan Briggs is a 63 y.o. male.     63 yo M with a chief complaints of right thoracic back pain.  This been going on for about a week or so.  Worse with palpation and movement twisting.  Denies trauma.  Denies numbness or tingling to the arm.  Denies fever.  The history is provided by the patient.  Shoulder Pain  Associated symptoms: back pain   Associated symptoms: no fever   Injury  This is a new problem. The current episode started more than 2 days ago. The problem occurs constantly. The problem has not changed since onset.Pertinent negatives include no chest pain, no abdominal pain, no headaches and no shortness of breath. The symptoms are aggravated by bending and twisting. Nothing relieves the symptoms. He has tried nothing for the symptoms. The treatment provided no relief.    Past Medical History:  Diagnosis Date  . Chronic back pain   . Glaucoma   . Lung cancer [facility])     Patient Active Problem List   Diagnosis Date Noted  . 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 Chronic lumbar radiculopathy 03/10/2015  . Postlaminectomy syndrome, lumbar region 03/14/2012  . Lumbar postlaminectomy syndrome 02/07/2012  . Sciatica 02/07/2012    Past Surgical History:  Procedure Laterality Date  . BACK SURGERY          Home Medications    Prior to Admission medications   Medication Sig Start Date End Date Taking? Authorizing Provider  diclofenac sodium (VOLTAREN) 1 % GEL Apply 4 g topically 4 (four) times daily. 03/31/19   Deno Etienne, DO  methocarbamol (ROBAXIN) 500 MG tablet Take 1 tablet (500 mg total) by mouth 2 (two) times daily as needed for muscle spasms. 07/28/18   Bayard Hugger, NP  Oxycodone HCl 10 MG TABS Take 1 tablet (10 mg total) by mouth  4 (four) times daily as needed. 03/22/19   Bayard Hugger, NP  prochlorperazine (COMPAZINE) 10 MG tablet Take 1 tablet by mouth every 6 (six) hours as needed. 12/10/18   [provider]  ranitidine (ZANTAC) 150 MG tablet TAKE 1 (ONE) TABLET BY MOUTH TWO TIMES DAILY, AS NEEDED 05/24/17   [provider]  tadalafil (CIALIS) 10 MG tablet Take 10 mg by mouth daily as needed.    [provider]    Family History No family history on file.  Social History Social History   Tobacco Use  . Smoking status: Former Smoker    Packs/day: 0.00    Years: 20.00    Pack years: 0.00    Types: Cigarettes  . Smokeless tobacco: Never Used  . Tobacco comment: desires to quit but starts again when upset or stressed  Substance Use Topics  . Alcohol use: Yes    Comment: only on holidays, beer  . Drug use: No     Allergies   Penicillin g and Penicillins   Review of Systems Review of Systems  Constitutional: Negative for chills and fever.  HENT: Negative for congestion and facial swelling.   Eyes: Negative for discharge and visual disturbance.  Respiratory: Negative for shortness of breath.   Cardiovascular: Negative for chest pain and palpitations.  Gastrointestinal: Negative for abdominal pain, diarrhea and vomiting.  Musculoskeletal: Positive for  arthralgias, back pain and myalgias.  Skin: Negative for color change and rash.  Neurological: Negative for tremors, syncope and headaches.  Psychiatric/Behavioral: Negative for confusion and dysphoric mood.     Physical Exam Updated Vital Signs BP (!) 147/77 (BP Location: Left Arm)   Pulse 80   Temp 98.6 F (37 C) (Oral)   Resp 16   Ht 6\' 4"  (1.93 m)   Wt 113.4 kg   SpO2 100%   BMI 30.43 kg/m   Physical Exam Vitals signs and nursing note reviewed.  Constitutional:      Appearance: He is well-developed.  HENT:     Head: Normocephalic and atraumatic.  Eyes:     Pupils: Pupils are equal, round, and reactive to  light.  Neck:     Musculoskeletal: Normal range of motion and neck supple.     Vascular: No JVD.  Cardiovascular:     Rate and Rhythm: Normal rate and regular rhythm.     Heart sounds: No murmur. No friction rub. No gallop.   Pulmonary:     Effort: No respiratory distress.     Breath sounds: No wheezing.  Abdominal:     General: There is no distension.     Tenderness: There is no guarding or rebound.  Musculoskeletal: Normal range of motion.        General: Tenderness present.     Comments: Full range of motion of the right shoulder without significant tenderness.  Pulse motor and sensation are intact distally.  Patient has a tender area that is between the scapula and the mid thoracic back.  Reproduces the patient's pain.  Skin:    Coloration: Skin is not pale.     Findings: No rash.  Neurological:     Mental Status: He is alert and oriented to person, place, and time.  Psychiatric:        Behavior: Behavior normal.      ED Treatments / Results  Labs (all labs ordered are listed, but only abnormal results are displayed) Labs Reviewed - No data to display  EKG None  Radiology No results found.  Procedures Procedures (including critical care time)  TRIGGER POINT INJECTION PROCEDURE NOTE Procedure authorized and performed by: Deno Etienne, DO  Patient given 2% lidocaine to the right thoracic back trigger points. Patient consents verbally to this.   Procedure: With ChloraPrep of the R paraspinal musculature.  Palpable firm area of tenderness consistent with trigger point is localized. It was approached with 25-gauge needle and 5 mL of 2% lidocaine was injected. Tolerated this well with immediate pain relief.     Medications Ordered in ED Medications  lidocaine-EPINEPHrine (XYLOCAINE W/EPI) 2 %-1:200000 (PF) injection 10 mL (has no administration in time range)     Initial Impression / Assessment and Plan / ED Course  I have reviewed the triage vital signs and the  nursing notes.  Pertinent labs & imaging results that were available during my care of the patient were reviewed by me and considered in my medical decision making (see chart for details).        63 yo M with a chief complaints of thoracic back pain.  Patient has of tender nodule between the scapula and the midline spine.  Trigger point injection was performed with some improvement.  Will prescribe Voltaren gel.  PCP follow-up.  6:57 PM:  I have discussed the diagnosis/risks/treatment options with the patient and family and believe the pt to be eligible for discharge home to follow-up  with PCP. We also discussed returning to the ED immediately if new or worsening sx occur. We discussed the sx which are most concerning (e.g., sudden worsening pain, fever, inability to tolerate by mouth) that necessitate immediate return. Medications administered to the patient during their visit and any new prescriptions provided to the patient are listed below.  Medications given during this visit Medications  lidocaine-EPINEPHrine (XYLOCAINE W/EPI) 2 %-1:200000 (PF) injection 10 mL (has no administration in time range)     The patient appears reasonably screen and/or stabilized for discharge and I doubt any other medical condition or other [facility] requiring further screening, evaluation, or treatment in the ED at this time prior to discharge.    Final Clinical Impressions(s) / ED Diagnoses   Final diagnoses:  Acute right-sided thoracic back pain    ED Discharge Orders         Ordered    diclofenac sodium (VOLTAREN) 1 % GEL  4 times daily     03/31/19 Sanders, [place], DO 03/31/19 1857

## 2019-04-24 ENCOUNTER — Other Ambulatory Visit: Payer: Self-pay

## 2019-04-24 ENCOUNTER — Encounter: Payer: Medicare HMO | Attending: Physical Medicine & Rehabilitation | Admitting: Registered Nurse

## 2019-04-24 ENCOUNTER — Encounter: Payer: Self-pay | Admitting: Registered Nurse

## 2019-04-24 VITALS — BP 161/83 | HR 86 | Temp 97.6°F | Resp 14 | Ht 76.0 in | Wt 246.0 lb

## 2019-04-24 DIAGNOSIS — H532 Diplopia: Secondary | ICD-10-CM

## 2019-04-24 DIAGNOSIS — Z5181 Encounter for therapeutic drug level monitoring: Secondary | ICD-10-CM | POA: Insufficient documentation

## 2019-04-24 DIAGNOSIS — Z79899 Other long term (current) drug therapy: Secondary | ICD-10-CM | POA: Diagnosis not present

## 2019-04-24 DIAGNOSIS — M961 Postlaminectomy syndrome, not elsewhere classified: Secondary | ICD-10-CM | POA: Diagnosis not present

## 2019-04-24 DIAGNOSIS — G894 Chronic pain syndrome: Secondary | ICD-10-CM | POA: Insufficient documentation

## 2019-04-24 DIAGNOSIS — T451X5A Adverse effect of antineoplastic and immunosuppressive drugs, initial encounter: Secondary | ICD-10-CM

## 2019-04-24 DIAGNOSIS — G620 Drug-induced polyneuropathy: Secondary | ICD-10-CM | POA: Diagnosis not present

## 2019-04-24 DIAGNOSIS — M25511 Pain in right shoulder: Secondary | ICD-10-CM | POA: Diagnosis not present

## 2019-04-24 DIAGNOSIS — M5416 Radiculopathy, lumbar region: Secondary | ICD-10-CM

## 2019-04-24 DIAGNOSIS — G8929 Other chronic pain: Secondary | ICD-10-CM

## 2019-04-24 MED ORDER — OXYCODONE HCL 10 MG PO TABS: 10.0000 mg | ORAL_TABLET | Freq: Four times a day (QID) | ORAL | 0 refills | Status: DC | PRN

## 2019-04-24 NOTE — Progress Notes (Signed)
Subjective:    Patient ID: Logan Briggs, male    DOB: 11-09-1955, 63 y.o.   MRN: [id number]  HPI: Logan Briggs is a 63 y.o. male who returns for follow up appointment for chronic pain and medication refill. He states his pain is located in his right shoulder and lower back pain radiating into his left lower extremity. He rates his pain 7. His current exercise regime is walking.   Mr. Wescoat went to [facility] ED on 03/31/2019 acute right sided thoracic back pain, he received a trigger point injection. He states today he didn't received any relief. Mr. Carandang was looking for injection, he has eye surgery planned at the end of the month he reports for double vision. We will not give a medrol dose pak. He was instructed to call office with opthamologist information, so we can see what type of surgery he is having. He verbalizes understanding.    Mr. Calcaterra Morphine equivalent is 60.00  MME.  Last UDS was performed on 01/18/2019, it was consistent.   Pain Inventory Average Pain 7 Pain Right Now 7 My pain is sharp and aching  In the last 24 hours, has pain interfered with the following? General activity 5 Relation with others 5 Enjoyment of life 6 What TIME of day is your pain at its worst? pain all day long Sleep (in general) Poor  Pain is worse with: some activites Pain improves with: medicine Relief from Meds: 5  Mobility walk without assistance  Function disabled: date disabled n/a  Neuro/Psych spasms  Prior Studies x-rays CT/MRI  Physicians involved in your care hospital   No family history on file. Social History   Socioeconomic History  . Marital status: Married    Spouse name: Not on file  . Number of children: Not on file  . Years of education: Not on file  . Highest education level: Not on file  Occupational History  . Not on file  Social Needs  . Financial resource strain: Not on file  . Food insecurity    Worry: Not on file   Inability: Not on file  . Transportation needs    Medical: Not on file    Non-medical: Not on file  Tobacco Use  . Smoking status: Former Smoker    Packs/day: 0.00    Years: 20.00    Pack years: 0.00    Types: Cigarettes  . Smokeless tobacco: Never Used  . Tobacco comment: desires to quit but starts again when upset or stressed  Substance and Sexual Activity  . Alcohol use: Yes    Comment: only on holidays, beer  . Drug use: No  . Sexual activity: Never  Lifestyle  . Physical activity    Days per week: Not on file    Minutes per session: Not on file  . Stress: Not on file  Relationships  . Social Herbalist on phone: Not on file    Gets together: Not on file    Attends religious service: Not on file    Active member of club or organization: Not on file    Attends meetings of clubs or organizations: Not on file    Relationship status: Not on file  Other Topics Concern  . Not on file  Social History Narrative  . Not on file   Past Surgical History:  Procedure Laterality Date  . BACK SURGERY     Past Medical History:  Diagnosis Date  . Chronic  back pain   . Glaucoma   . Lung cancer ([place])    There were no vitals taken for this visit.  Opioid Risk Score:   Fall Risk Score:  `1  Depression screen PHQ 2/9  Depression screen [facility] 2/9 03/22/2019 12/19/2018 08/21/2018 02/21/2018 12/05/2017 08/26/2017 05/27/2017  Decreased Interest 0 0 0 0 0 0 0  Down, Depressed, Hopeless 0 0 0 0 0 0 0  PHQ - 2 Score 0 0 0 0 0 0 0  Altered sleeping - - - - - - -  Tired, decreased energy - - - - - - -  Change in appetite - - - - - - -  Feeling bad or failure about yourself  - - - - - - -  Trouble concentrating - - - - - - -  Moving slowly or fidgety/restless - - - - - - -  Suicidal thoughts - - - - - - -  PHQ-9 Score - - - - - - -      Review of Systems     Objective:   Physical Exam Vitals signs and nursing note reviewed.  Constitutional:      Appearance: Normal  appearance.  Neck:     Musculoskeletal: Normal range of motion and neck supple.  Cardiovascular:     Rate and Rhythm: Normal rate and regular rhythm.     Pulses: Normal pulses.     Heart sounds: Normal heart sounds.  Pulmonary:     Effort: Pulmonary effort is normal.     Breath sounds: Normal breath sounds.  Musculoskeletal:     Comments: Normal Muscle Bulk and Muscle Testing Reveals:  Upper Extremities: Full ROM and Muscle Strength 5/5 Right AC Joint Tenderness Back without spinal tenderness noted Lower Extremities: Full ROM and Muscle Strength 5/5 Arises from Table Slowly Narrow Based  Gait   Skin:    General: Skin is warm and dry.  Neurological:     Mental Status: He is alert and oriented to person, place, and time.  Psychiatric:        Mood and Affect: Mood normal.        Behavior: Behavior normal.           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 We will continue the opioid monitoring program, tis consists of regular clinic visits, examinations, urine drug screen, pill counts as well as use of [place] Controlled Substance Reporting System.  Continue with exercise regime. 2. Lumbar Radiculopathy/ Chronic Radiculopathy/ Chemotherapy Induced Neuropathy: Mr. Blessinger reports he discontinued the Gabapentin due to side effects.  Oncology Following.04/25/2019.. 3. Debility secondary to his lung cancer:Reports good appetite, Continue to monitor.04/25/2019 4. Muscle Spasm: Continue Methocarbamol.04/25/2019. 5. Diplopia: Ophthalmology Following.    F/U in 1 Month  20 minutes of face to face patient care time was spent during this visit. All questions were encouraged and answered.

## 2019-04-25 ENCOUNTER — Telehealth: Payer: Self-pay | Admitting: Registered Nurse

## 2019-04-25 NOTE — Telephone Encounter (Signed)
Voicemail was left after hours 04/24/19 to leave you contact information for [facility]. [facility] [phone] Dr Julian Reil

## 2019-04-26 NOTE — Telephone Encounter (Signed)
Placed a call to [facility] for [name] for clarification on his eye surgery. [facility] staff states [name] is schedule for Glaucoma evaluation on 04/30/2019, this provider will speak with Dr. Letta Pate regarding [name] request for right shoulder injection. Placed a call to [name] regarding the above, he verbalizes understanding.

## 2019-04-30 ENCOUNTER — Emergency Department (HOSPITAL_BASED_OUTPATIENT_CLINIC_OR_DEPARTMENT_OTHER): Payer: Medicare HMO

## 2019-04-30 ENCOUNTER — Other Ambulatory Visit: Payer: Self-pay

## 2019-04-30 ENCOUNTER — Encounter (HOSPITAL_BASED_OUTPATIENT_CLINIC_OR_DEPARTMENT_OTHER): Payer: Self-pay | Admitting: Emergency Medicine

## 2019-04-30 ENCOUNTER — Emergency Department (HOSPITAL_BASED_OUTPATIENT_CLINIC_OR_DEPARTMENT_OTHER)
Admission: EM | Admit: 2019-04-30 | Discharge: 2019-04-30 | Disposition: A | Discharge status: Home / Self Care | Payer: Medicare HMO | Attending: Emergency Medicine | Admitting: Emergency Medicine

## 2019-04-30 DIAGNOSIS — M25511 Pain in right shoulder: Secondary | ICD-10-CM | POA: Diagnosis not present

## 2019-04-30 DIAGNOSIS — M545 Low back pain: Secondary | ICD-10-CM | POA: Diagnosis not present

## 2019-04-30 DIAGNOSIS — Z87891 Personal history of nicotine dependence: Secondary | ICD-10-CM | POA: Diagnosis not present

## 2019-04-30 DIAGNOSIS — G8929 Other chronic pain: Secondary | ICD-10-CM | POA: Insufficient documentation

## 2019-04-30 MED ORDER — HYDROMORPHONE HCL 1 MG/ML IJ SOLN
1.0000 mg | Freq: Once | INTRAMUSCULAR | Status: AC
  Administered 2019-04-30: 21:00:00 1 mg via INTRAMUSCULAR
  Filled 2019-04-30: qty 1

## 2019-04-30 MED ORDER — PREDNISONE 50 MG PO TABS
60.0000 mg | ORAL_TABLET | Freq: Once | ORAL | Status: AC
  Administered 2019-04-30: 60 mg via ORAL
  Filled 2019-04-30: qty 1

## 2019-04-30 MED ORDER — PREDNISONE 20 MG PO TABS: ORAL_TABLET | ORAL | 0 refills | Status: AC

## 2019-04-30 NOTE — ED Provider Notes (Signed)
[place] EMERGENCY DEPARTMENT Provider Note   CSN: [id number] Arrival date & time: 04/30/19  1836     History   Chief Complaint Chief Complaint  Patient presents with  . Back Pain    HPI Logan Briggs is a 63 y.o. male.     Patient c/o right shoulder pain for the past week. Symptoms gradual onset, constant, moderate-severe, worse w certain movements/position of shoulder. Denies specific injury or trauma to shoulder. States may have slept awkwardly on area. Has hx chronic back pain, no change in that pain. Denies radicular pain or any arm numbness/weakness. No fever or chills. No chest pain or sob. No neck pain.  The history is provided by the patient.  Back Pain Associated symptoms: no abdominal pain, no chest pain, no fever, no headaches, no numbness and no weakness     Past Medical History:  Diagnosis Date  . Chronic back pain   . Glaucoma   . Lung cancer [facility])     Patient Active Problem List   Diagnosis Date Noted  . 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 Chronic lumbar radiculopathy 03/10/2015  . Postlaminectomy syndrome, lumbar region 03/14/2012  . Lumbar postlaminectomy syndrome 02/07/2012  . Sciatica 02/07/2012    Past Surgical History:  Procedure Laterality Date  . BACK SURGERY          Home Medications    Prior to Admission medications   Medication Sig Start Date End Date Taking? Authorizing Provider  diclofenac sodium (VOLTAREN) 1 % GEL Apply 4 g topically 4 (four) times daily. 03/31/19   Deno Etienne, DO  methocarbamol (ROBAXIN) 500 MG tablet Take 1 tablet (500 mg total) by mouth 2 (two) times daily as needed for muscle spasms. 07/28/18   Bayard Hugger, NP  Oxycodone HCl 10 MG TABS Take 1 tablet (10 mg total) by mouth 4 (four) times daily as needed. 04/24/19   Bayard Hugger, NP  prochlorperazine (COMPAZINE) 10 MG tablet Take 1 tablet by mouth every 6 (six) hours as needed. 12/10/18   [provider]  ranitidine  (ZANTAC) 150 MG tablet TAKE 1 (ONE) TABLET BY MOUTH TWO TIMES DAILY, AS NEEDED 05/24/17   [provider]  tadalafil (CIALIS) 10 MG tablet Take 10 mg by mouth daily as needed.    [provider]    Family History No family history on file.  Social History Social History   Tobacco Use  . Smoking status: Former Smoker    Packs/day: 0.00    Years: 20.00    Pack years: 0.00    Types: Cigarettes  . Smokeless tobacco: Never Used  . Tobacco comment: desires to quit but starts again when upset or stressed  Substance Use Topics  . Alcohol use: Yes    Comment: only on holidays, beer  . Drug use: No     Allergies   Penicillin g and Penicillins   Review of Systems Review of Systems  Constitutional: Negative for fever.  HENT: Negative for sore throat.   Eyes: Negative for redness.  Respiratory: Negative for cough and shortness of breath.   Cardiovascular: Negative for chest pain.  Gastrointestinal: Negative for abdominal pain.  Genitourinary: Negative for flank pain.  Musculoskeletal: Positive for back pain. Negative for neck pain.  Skin: Negative for rash.  Neurological: Negative for weakness, numbness and headaches.  Hematological: Does not bruise/bleed easily.  Psychiatric/Behavioral: Negative for confusion.     Physical Exam Updated Vital Signs BP 126/78 (BP Location: Left Arm)  Pulse (!) 101   Resp 18   Ht 1.905 m (6\' 3" )   Wt 111.6 kg   SpO2 96%   BMI 30.75 kg/m   Physical Exam Vitals signs and nursing note reviewed.  Constitutional:      Appearance: Normal appearance. He is well-developed.  HENT:     Head: Atraumatic.     Nose: Nose normal.     Mouth/Throat:     Mouth: Mucous membranes are moist.     Pharynx: Oropharynx is clear.  Eyes:     General: No scleral icterus.    Conjunctiva/sclera: Conjunctivae normal.  Neck:     Musculoskeletal: Normal range of motion and neck supple. No neck rigidity or muscular tenderness.     Trachea:  No tracheal deviation.  Cardiovascular:     Rate and Rhythm: Normal rate and regular rhythm.     Pulses: Normal pulses.     Heart sounds: Normal heart sounds. No murmur. No friction rub. No gallop.   Pulmonary:     Effort: Pulmonary effort is normal. No accessory muscle usage or respiratory distress.     Breath sounds: Normal breath sounds.  Abdominal:     General: Bowel sounds are normal. There is no distension.     Palpations: Abdomen is soft.     Tenderness: There is no abdominal tenderness.  Genitourinary:    Comments: No cva tenderness. Musculoskeletal:        General: No swelling.     Comments: CTLS spine, non tender, aligned, no step off. Good rom c spine without pain. Right shoulder tenderness. No sts or skin changes. No shoulder pain w passive rom. Radial pulse 2+. No arm swelling.   Skin:    General: Skin is warm and dry.     Findings: No rash.  Neurological:     Mental Status: He is alert.     Comments: Alert, speech clear. RUE motor intact, stre 5/5. sens grossly intact.   Psychiatric:        Mood and Affect: Mood normal.      ED Treatments / Results  Labs (all labs ordered are listed, but only abnormal results are displayed) Labs Reviewed - No data to display  EKG    Radiology Dg Shoulder Right  Result Date: 04/30/2019 CLINICAL DATA:  Right shoulder pain. Patient reports pain for 2 months after fall. EXAM: RIGHT SHOULDER - 2+ VIEW COMPARISON:  None. FINDINGS: There is no evidence of acute or healing fracture. No dislocation. Mild acromioclavicular degenerative change. No evidence of focal bone lesion or bony destruction. Small curvilinear calcification in the region of the rotator cuff insertion. IMPRESSION: 1. No acute or healing fracture of the right shoulder. 2. Mild acromioclavicular degenerative change. 3. Small curvilinear calcification in the region of the rotator cuff insertion, can be seen with calcific tendinitis. Electronically Signed   By: Keith Rake M.D.   On: 04/30/2019 21:24    Procedures Procedures (including critical care time)  Medications Ordered in ED Medications  HYDROmorphone (DILAUDID) injection 1 mg (1 mg Intramuscular Given 04/30/19 2114)  predniSONE (DELTASONE) tablet 60 mg (60 mg Oral Given 04/30/19 2114)     Initial Impression / Assessment and Plan / ED Course  I have reviewed the triage vital signs and the nursing notes.  Pertinent labs & imaging results that were available during my care of the patient were reviewed by me and considered in my medical decision making (see chart for details).  Xrays. Pt indicates has  ride, does not have to drive tonight. Dilaudid 1 mg im.   Reviewed nursing notes and prior charts for additional history. Note made of recently receiving 120 oxycodone for chronic back pain.  Xrays reviewed by me - mild degen changes, no fx, ?calc tendonitis.   Discussed xrays w pt.   rec pcp f/u.     Final Clinical Impressions(s) / ED Diagnoses   Final diagnoses:  None    ED Discharge Orders    None       Lajean Saver, MD 04/30/19 2155

## 2019-04-30 NOTE — ED Triage Notes (Addendum)
Back pain x 3 months, has been eval in ED for the same . Was eval by PCP on 04/24/19 and given script for oxycodone#120 tabs

## 2019-04-30 NOTE — Discharge Instructions (Addendum)
It was our pleasure to provide your ER care today - we hope that you feel better.  Take prednisone as prescribed.  You may also take your pain medication as need.  Follow up with primary care doctor in the coming week - discuss possible referral to orthopedist if shoulder pain persists.  Return to ER if worse, new symptoms, fevers, numbness/weakness, other concern.  You were given pain medication in the ER - no driving for the next 6 hours.

## 2019-05-08 ENCOUNTER — Telehealth: Payer: Self-pay

## 2019-05-08 NOTE — Telephone Encounter (Signed)
Patient called requesting a call back from [place].  Called him back today and stated he was wanting to see about a shot but he already got it taken care of

## 2019-05-09 ENCOUNTER — Other Ambulatory Visit: Payer: Self-pay | Admitting: Nurse Practitioner

## 2019-05-09 DIAGNOSIS — M5416 Radiculopathy, lumbar region: Secondary | ICD-10-CM

## 2019-05-25 ENCOUNTER — Encounter: Payer: Medicare HMO | Attending: Physical Medicine & Rehabilitation | Admitting: Physical Medicine & Rehabilitation

## 2019-05-25 ENCOUNTER — Other Ambulatory Visit: Payer: Self-pay

## 2019-05-25 ENCOUNTER — Encounter: Payer: Self-pay | Admitting: Physical Medicine & Rehabilitation

## 2019-05-25 VITALS — BP 112/74 | HR 117 | Temp 99.2°F | Ht 75.0 in | Wt 225.0 lb

## 2019-05-25 DIAGNOSIS — R29898 Other symptoms and signs involving the musculoskeletal system: Secondary | ICD-10-CM | POA: Diagnosis not present

## 2019-05-25 DIAGNOSIS — M542 Cervicalgia: Secondary | ICD-10-CM | POA: Diagnosis not present

## 2019-05-25 DIAGNOSIS — G894 Chronic pain syndrome: Secondary | ICD-10-CM | POA: Insufficient documentation

## 2019-05-25 DIAGNOSIS — Z5181 Encounter for therapeutic drug level monitoring: Secondary | ICD-10-CM | POA: Insufficient documentation

## 2019-05-25 DIAGNOSIS — Z79899 Other long term (current) drug therapy: Secondary | ICD-10-CM | POA: Diagnosis present

## 2019-05-25 MED ORDER — OXYCODONE HCL 10 MG PO TABS: 10.0000 mg | ORAL_TABLET | Freq: Four times a day (QID) | ORAL | 0 refills | Status: AC | PRN

## 2019-05-25 MED ORDER — TAPENTADOL HCL 75 MG PO TABS: 75.0000 mg | ORAL_TABLET | Freq: Three times a day (TID) | ORAL | 0 refills | Status: AC

## 2019-05-25 NOTE — Patient Instructions (Signed)
Please see PCP for your Heart Rate   Referral made to Dr Christella Noa to evaluate  Try nucynta in addition to oxycodone, if helpful may be able to wean off oxycodone

## 2019-05-25 NOTE — Progress Notes (Signed)
Subjective:    Patient ID: Logan Briggs, male    DOB: [birth year], 63 y.o.   MRN: [id number]  HPI CC:  Left leg pain but has secondary complaint of right shoulder weakness  Lumbar post lami syndrome with chronic left L4 radiculopathy.  He has tried atypical anticonvulsants for this pain without much success.  He was having reasonable control with oxycodone 10 mg 4 times daily up until recently. 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  He tried to 300 mg but this was too strong for him.  Constipation, has started laxatives recently.  The patient was seen in the emergency department on 04/30/2019 at [facility] with symptoms of right shoulder pain.  X-rays showed some mild acromioclavicular joint arthritis otherwise no abnormalities. He was administered Dilaudid 1 mg IM which gave him some temporary relief.   Pain Inventory Average Pain 7 Pain Right Now 7 My pain is sharp and stabbing  In the last 24 hours, has pain interfered with the following? General activity 5 Relation with others 5 Enjoyment of life 6 What TIME of day is your pain at its worst? evening Sleep (in general) Good  Pain is worse with: some activites Pain improves with: heat/ice and medication Relief from Meds: 7  Mobility walk without assistance ability to climb steps?  yes do you drive?  yes use a wheelchair  Function Do you have any goals in this area?  no  Neuro/Psych weakness trouble walking  Prior Studies Any changes since last visit?  no Study Result  CLINICAL DATA:  Right shoulder pain. Patient reports pain for 2 months after fall.  EXAM: RIGHT SHOULDER - 2+ VIEW  COMPARISON:  None.  FINDINGS: There is no evidence of acute or healing fracture. No dislocation. Mild acromioclavicular degenerative change. No evidence of focal bone lesion or bony destruction. Small  curvilinear calcification in the region of the rotator cuff insertion.  IMPRESSION: 1. No acute or healing fracture of the right shoulder. 2. Mild acromioclavicular degenerative change. 3. Small curvilinear calcification in the region of the rotator cuff insertion, can be seen with calcific tendinitis.   Electronically Signed   By: Keith Rake M.D.   On: 04/30/2019 21:24    Physicians involved in your care Any changes since last visit?  no   No family history on file. Social History   Socioeconomic History  . Marital status: Married    Spouse name: Not on file  . Number of children: Not on file  . Years of education: Not on file  . Highest education level: Not on file  Occupational History  . Not on file  Social Needs  . Financial resource strain: Not on file  . Food insecurity    Worry: Not on file    Inability: Not on file  . Transportation needs    Medical: Not on file    Non-medical: Not on file  Tobacco Use  . Smoking status: Former Smoker    Packs/day: 0.00    Years: 20.00    Pack years: 0.00    Types: Cigarettes  . Smokeless tobacco: Never Used  . Tobacco comment: desires to quit but starts again when upset or stressed  Substance and Sexual Activity  . Alcohol use: Yes    Comment: only on holidays, beer  . Drug use: No  . Sexual activity: Never  Lifestyle  .  Physical activity    Days per week: Not on file    Minutes per session: Not on file  . Stress: Not on file  Relationships  . Social Herbalist on phone: Not on file    Gets together: Not on file    Attends religious service: Not on file    Active member of club or organization: Not on file    Attends meetings of clubs or organizations: Not on file    Relationship status: Not on file  Other Topics Concern  . Not on file  Social History Narrative  . Not on file   Past Surgical History:  Procedure Laterality Date  . BACK SURGERY     Past Medical History:  Diagnosis  Date  . Chronic back pain   . Glaucoma   . Lung cancer ([place])    Ht 6\' 3"  (1.905 m)   Wt 225 lb (102.1 kg)   BMI 28.12 kg/m   Opioid Risk Score:   Fall Risk Score:  `1  Depression screen PHQ 2/9  Depression screen [facility] 2/9 03/22/2019 12/19/2018 08/21/2018 02/21/2018 12/05/2017 08/26/2017 05/27/2017  Decreased Interest 0 0 0 0 0 0 0  Down, Depressed, Hopeless 0 0 0 0 0 0 0  PHQ - 2 Score 0 0 0 0 0 0 0  Altered sleeping - - - - - - -  Tired, decreased energy - - - - - - -  Change in appetite - - - - - - -  Feeling bad or failure about yourself  - - - - - - -  Trouble concentrating - - - - - - -  Moving slowly or fidgety/restless - - - - - - -  Suicidal thoughts - - - - - - -  PHQ-9 Score - - - - - - -     Review of Systems  Constitutional: Positive for fatigue.  HENT: Negative.   Eyes: Negative.   Respiratory: Negative.   Cardiovascular: Negative.   Gastrointestinal: Negative.   Endocrine: Negative.   Genitourinary: Negative.   Musculoskeletal: Positive for arthralgias, back pain, gait problem and myalgias.  Skin: Negative.   Allergic/Immunologic: Negative.   Neurological: Positive for weakness.  Hematological: Negative.   Psychiatric/Behavioral: Negative.   All other systems reviewed and are negative.      Objective:   Physical Exam Constitutional:      Appearance: Normal appearance.  HENT:     Head: Normocephalic and atraumatic.  Eyes:     Extraocular Movements: Extraocular movements intact.     Conjunctiva/sclera: Conjunctivae normal.     Pupils: Pupils are equal, round, and reactive to light.  Musculoskeletal:     Right shoulder: He exhibits decreased range of motion.     Comments: There is decreased active range of motion right shoulder.  There is no pain to palpation over the shoulder and no pain with passive range of motion.  Neurological:     Mental Status: He is alert.  Psychiatric:        Attention and Perception: Attention normal.        Mood and  Affect: Affect is blunt.        Speech: Speech normal.        Behavior: Behavior is withdrawn.        Thought Content: Thought content normal.        Cognition and Memory: Cognition normal.        Judgment: Judgment normal.  Motor strength is trace at the right deltoid, 4 at the biceps and triceps 5 in the finger flexors and extensors 5/5 in the right hip flexor knee extensor ankle dorsiflexor 5/5 in the left deltoid bicep tricep grip hip flexor knee extensor ankle dorsiflexor Deep tendon reflexes are 1+ bilateral biceps triceps brachioradialis. Patient indicates that he has decreased sensation to light touch in C6-C7-C8 dermatomes in bilateral upper extremities.  There is mild hand intrinsic atrophy right greater than left side.       Assessment & Plan:  1.  History of chronic left L4 radiculitis as well as chronic low back pain.  He has had some increased pain in the right periscapular area less so in the neck.  He states that his pain is keeping him up at night.  We discussed that if he would need to continue his oxycodone 10 mg 4 times daily will add some Nucynta 75 mg 3 times daily as needed.  If he gets good relief with this we may be able to wean him down on the oxycodone. 2.  Right shoulder weakness and pain.  I suspect he may have a cervical radiculopathy.  Will check cervical spine x-rays.  Refer him back to Dr. Christella Noa who did his lumbar spine surgery. We will see him in 1 month, may need EMG

## 2019-05-28 ENCOUNTER — Emergency Department (HOSPITAL_BASED_OUTPATIENT_CLINIC_OR_DEPARTMENT_OTHER)
Admission: EM | Admit: 2019-05-28 | Discharge: 2019-05-28 | Discharge status: Against Medical Advice | Payer: Medicare HMO | Attending: Emergency Medicine | Admitting: Emergency Medicine

## 2019-05-28 ENCOUNTER — Ambulatory Visit (HOSPITAL_BASED_OUTPATIENT_CLINIC_OR_DEPARTMENT_OTHER)
Admission: RE | Admit: 2019-05-28 | Discharge: 2019-05-28 | Disposition: A | Discharge status: Home / Self Care | Payer: Medicare HMO | Source: Ambulatory Visit | Attending: Physical Medicine & Rehabilitation | Admitting: Physical Medicine & Rehabilitation

## 2019-05-28 ENCOUNTER — Encounter (HOSPITAL_BASED_OUTPATIENT_CLINIC_OR_DEPARTMENT_OTHER): Payer: Self-pay

## 2019-05-28 ENCOUNTER — Other Ambulatory Visit: Payer: Self-pay

## 2019-05-28 DIAGNOSIS — R197 Diarrhea, unspecified: Secondary | ICD-10-CM | POA: Insufficient documentation

## 2019-05-28 DIAGNOSIS — R109 Unspecified abdominal pain: Secondary | ICD-10-CM | POA: Diagnosis present

## 2019-05-28 DIAGNOSIS — Z5321 Procedure and treatment not carried out due to patient leaving prior to being seen by health care provider: Secondary | ICD-10-CM | POA: Insufficient documentation

## 2019-05-28 DIAGNOSIS — R112 Nausea with vomiting, unspecified: Secondary | ICD-10-CM | POA: Insufficient documentation

## 2019-05-28 DIAGNOSIS — M542 Cervicalgia: Secondary | ICD-10-CM

## 2019-05-28 NOTE — ED Notes (Signed)
Pt seen leaving ED by staff with quick steady gait.

## 2019-05-28 NOTE — ED Triage Notes (Signed)
Pt c/o abd pain x 4 weeks-denies n/v/d-states he was to see GI doctor today "but missed my appt"-to triage in w/c

## 2019-05-30 ENCOUNTER — Telehealth: Payer: Self-pay | Admitting: *Deleted

## 2019-05-30 NOTE — Telephone Encounter (Signed)
I notified MR Hallenbeck.  Please see if you can move his appointment up (sched 8/20) to see Dr Letta Pate about his result. He was asking is there not a shot he can get.  He can discuss at appt.

## 2019-05-30 NOTE — Telephone Encounter (Signed)
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

## 2019-06-05 ENCOUNTER — Other Ambulatory Visit: Payer: Medicare HMO

## 2019-06-08 ENCOUNTER — Encounter: Payer: Medicare HMO | Admitting: Physical Medicine & Rehabilitation

## 2019-06-28 ENCOUNTER — Ambulatory Visit: Payer: Medicare HMO | Admitting: Physical Medicine & Rehabilitation

## 2019-07-06 ENCOUNTER — Encounter: Payer: Medicare HMO | Admitting: Physical Medicine & Rehabilitation

## 2019-07-10 DEATH — deceased

## 2020-03-11 IMAGING — DX RIGHT SHOULDER - 2+ VIEW
3 series · 3 of 3 positions shown · non-contrast
Comparison: None.

CLINICAL DATA: Right shoulder pain. Patient reports pain for 2
months after fall.

EXAM:
RIGHT SHOULDER - 2+ VIEW

[shoulder grashey]
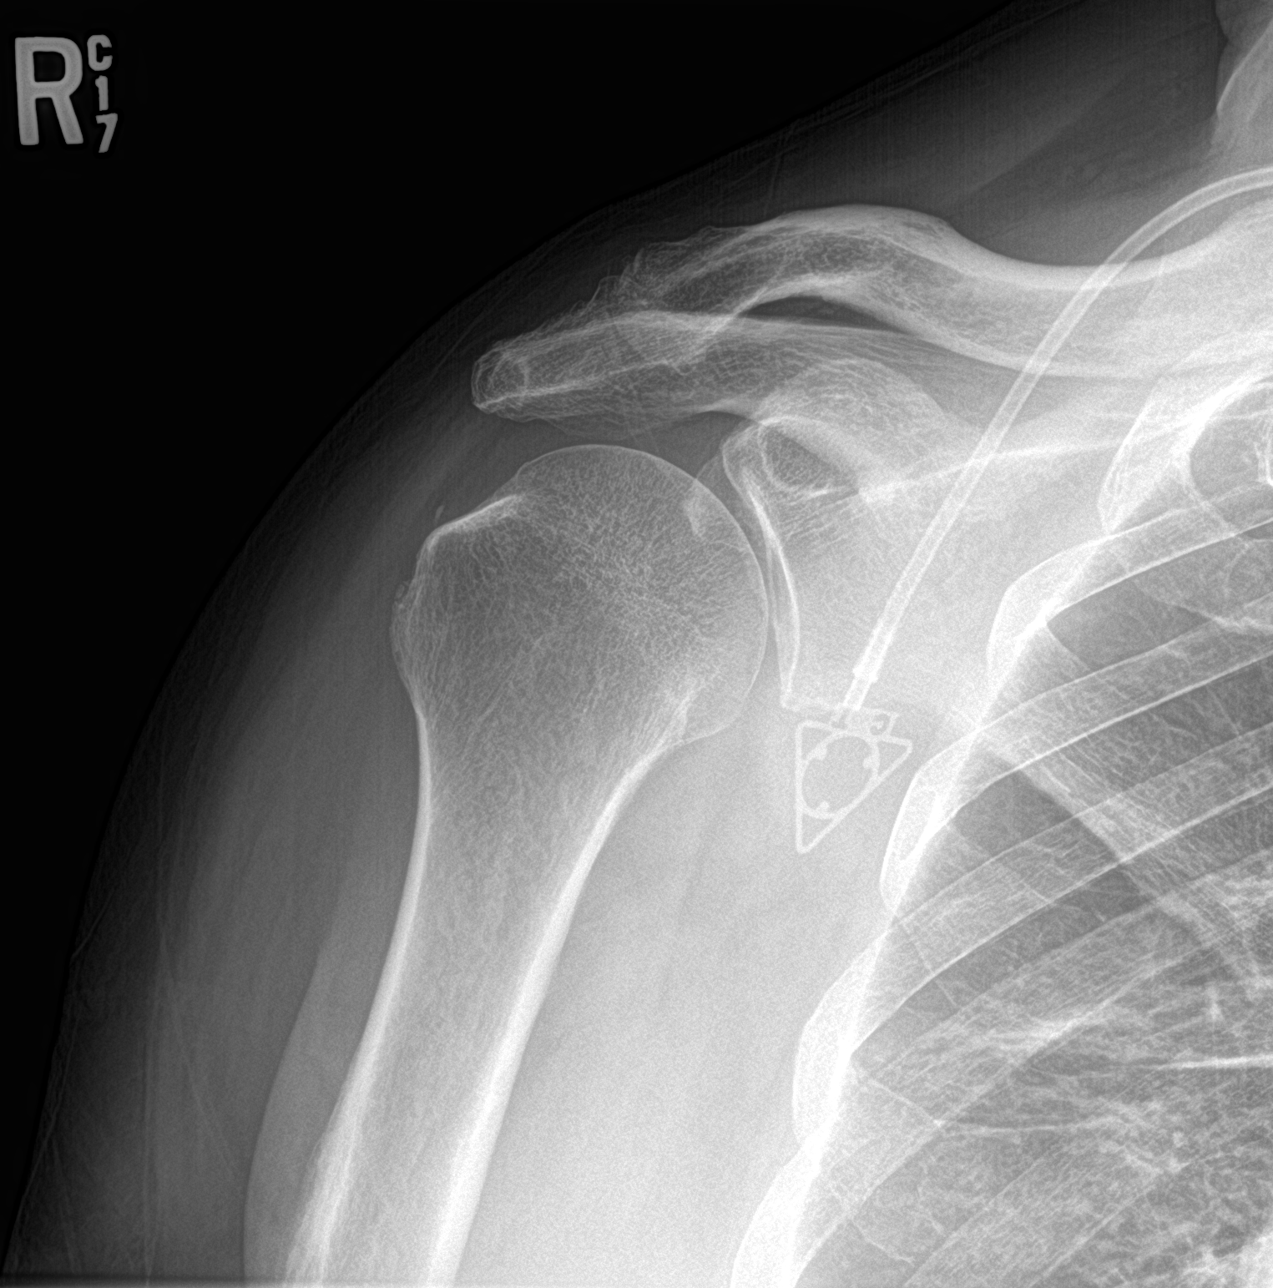

[shoulder y view]
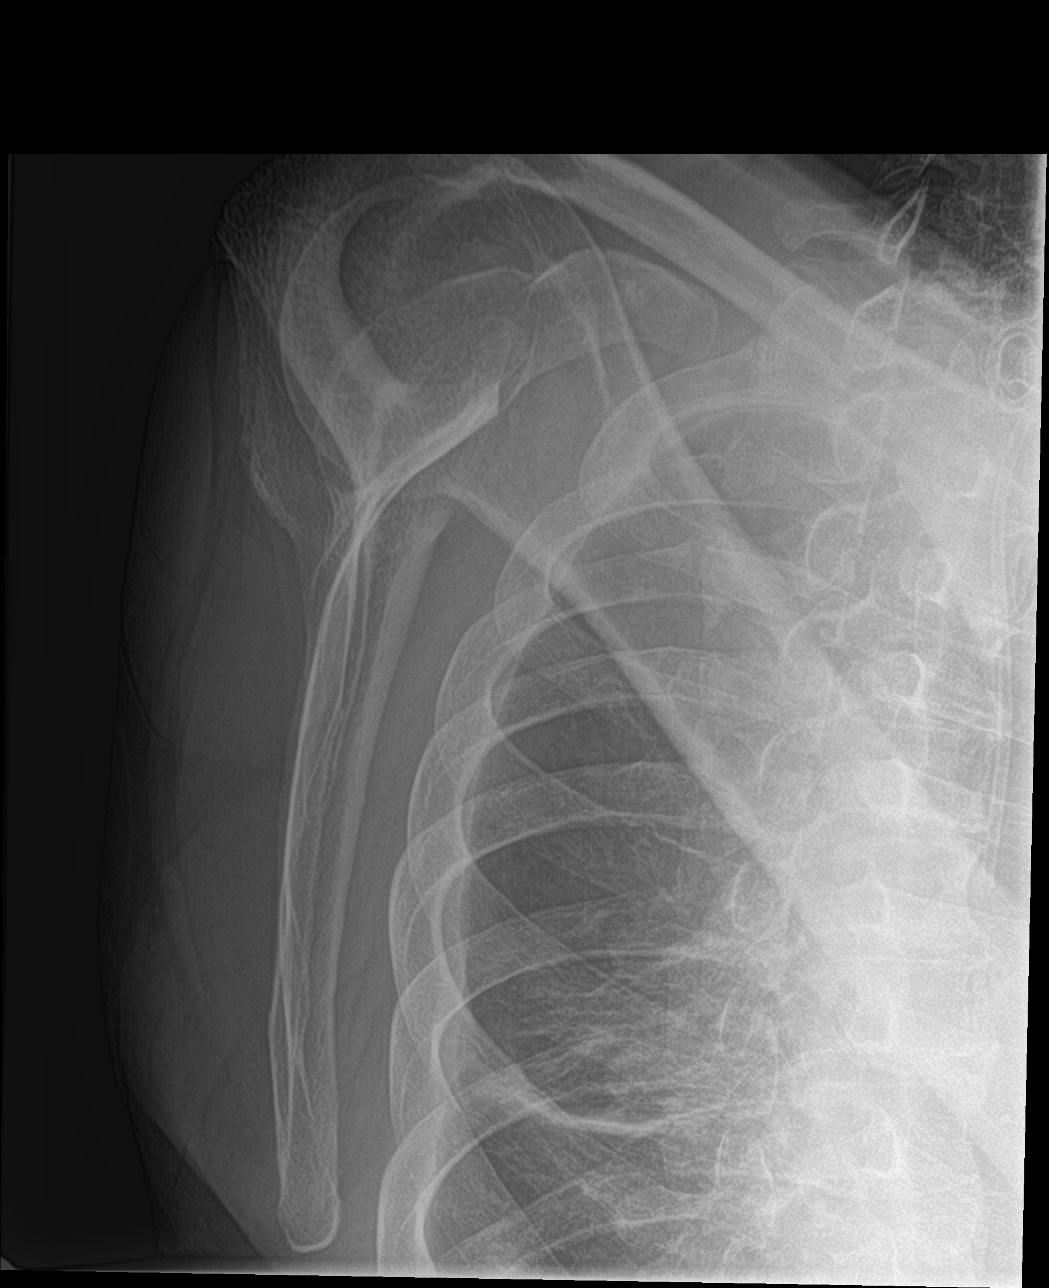

[shoulder axillary]
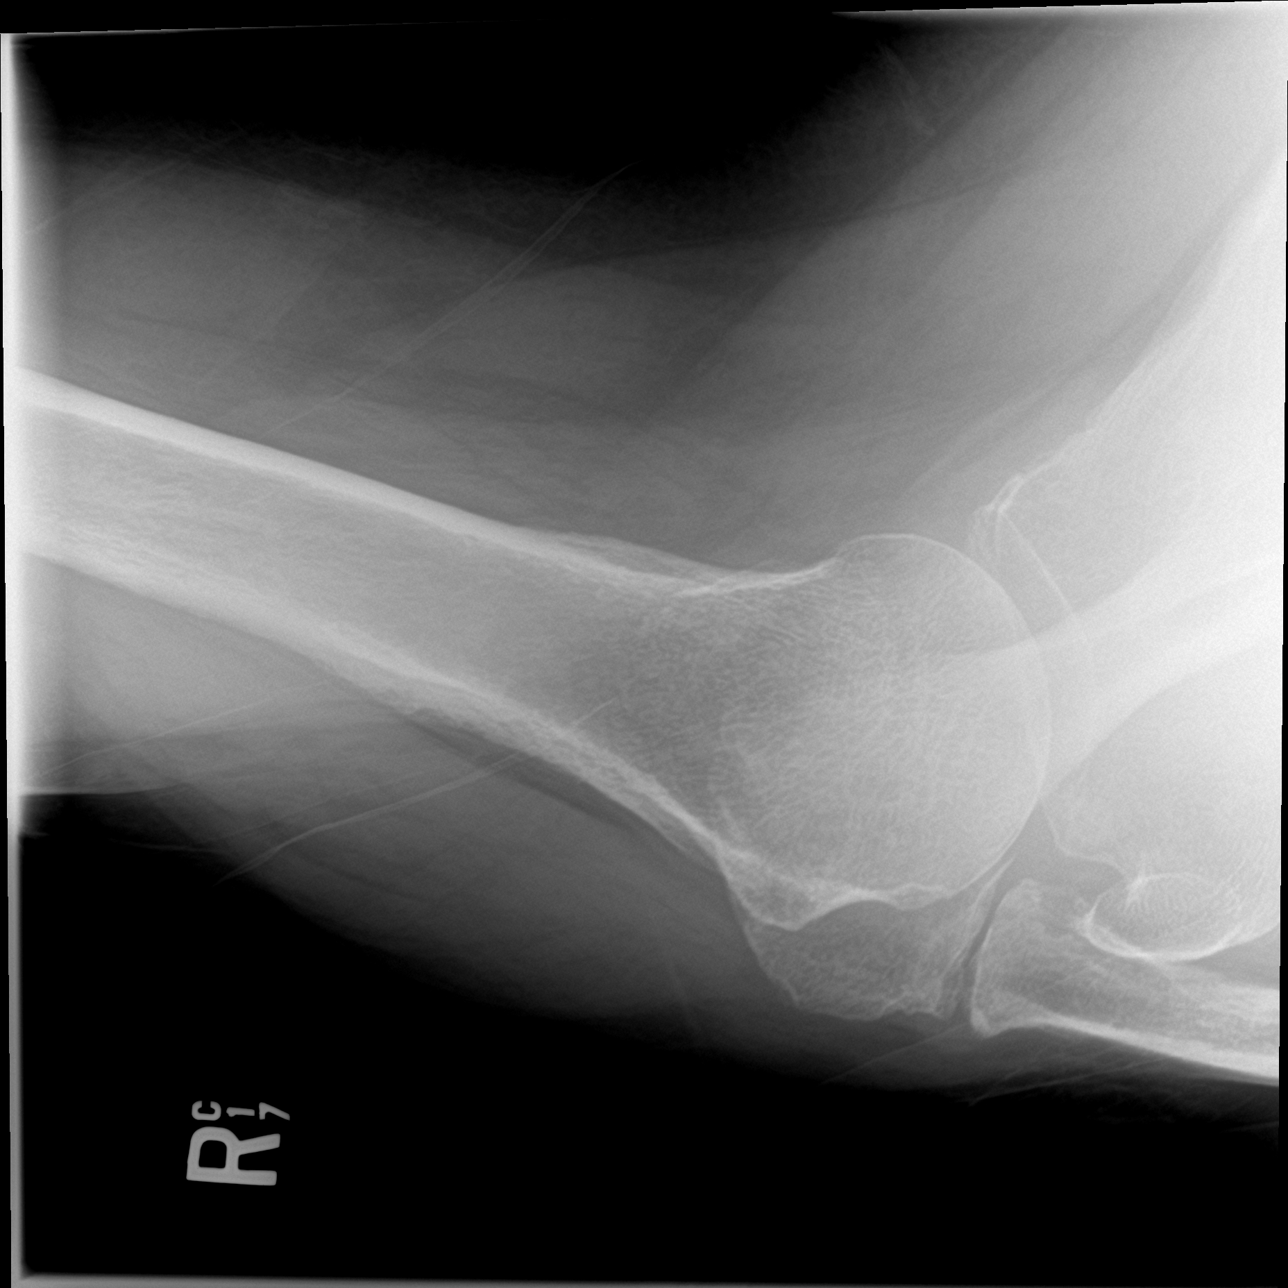

[3 of 3 positions shown; findings below may reference images not displayed]

FINDINGS: There is no evidence of acute or healing fracture. No dislocation.
Mild acromioclavicular degenerative change. No evidence of focal
bone lesion or bony destruction. Small curvilinear calcification in
the region of the rotator cuff insertion.
IMPRESSION: 1. No acute or healing fracture of the right shoulder.
2. Mild acromioclavicular degenerative change.
3. Small curvilinear calcification in the region of the rotator cuff
insertion, can be seen with calcific tendinitis.
